# Patient Record
Sex: Male | Born: 1940 | Race: White | Hispanic: No | Marital: Married | Smoking: Former smoker
Health system: Southern US, Community
[De-identification: ages and names within clinical notes are randomized; demographics above are authoritative.]

## PROBLEM LIST (undated history)

## (undated) DIAGNOSIS — M109 Gout, unspecified: Secondary | ICD-10-CM

## (undated) DIAGNOSIS — M1990 Unspecified osteoarthritis, unspecified site: Secondary | ICD-10-CM

## (undated) DIAGNOSIS — F32A Depression, unspecified: Secondary | ICD-10-CM

## (undated) DIAGNOSIS — I10 Essential (primary) hypertension: Secondary | ICD-10-CM

## (undated) DIAGNOSIS — G4730 Sleep apnea, unspecified: Secondary | ICD-10-CM

## (undated) DIAGNOSIS — E119 Type 2 diabetes mellitus without complications: Secondary | ICD-10-CM

## (undated) DIAGNOSIS — I499 Cardiac arrhythmia, unspecified: Secondary | ICD-10-CM

## (undated) DIAGNOSIS — F329 Major depressive disorder, single episode, unspecified: Secondary | ICD-10-CM

## (undated) HISTORY — PX: FINGER ARTHROSCOPY: SHX5001

---

## 1999-05-13 ENCOUNTER — Ambulatory Visit: Admission: RE | Admit: 1999-05-13 | Discharge: 1999-05-13 | Payer: Self-pay | Admitting: Internal Medicine

## 2005-09-30 ENCOUNTER — Ambulatory Visit: Payer: Self-pay | Admitting: Internal Medicine

## 2005-11-03 ENCOUNTER — Ambulatory Visit: Payer: Self-pay | Admitting: Internal Medicine

## 2009-01-11 ENCOUNTER — Ambulatory Visit: Payer: Self-pay | Admitting: Internal Medicine

## 2010-02-22 ENCOUNTER — Inpatient Hospital Stay: Payer: Self-pay | Admitting: Internal Medicine

## 2010-05-08 ENCOUNTER — Emergency Department: Payer: Self-pay | Admitting: Emergency Medicine

## 2011-04-24 ENCOUNTER — Ambulatory Visit: Payer: Self-pay | Admitting: Internal Medicine

## 2011-08-26 ENCOUNTER — Ambulatory Visit: Payer: Self-pay | Admitting: Internal Medicine

## 2011-09-12 ENCOUNTER — Ambulatory Visit: Payer: Self-pay | Admitting: Unknown Physician Specialty

## 2012-08-27 ENCOUNTER — Other Ambulatory Visit: Payer: Self-pay | Admitting: Orthopedic Surgery

## 2012-09-02 ENCOUNTER — Encounter (HOSPITAL_COMMUNITY)
Admission: RE | Admit: 2012-09-02 | Discharge: 2012-09-02 | Disposition: A | Discharge status: Home / Self Care | Payer: Medicare Other | Source: Ambulatory Visit | Attending: Orthopedic Surgery | Admitting: Orthopedic Surgery

## 2012-09-02 ENCOUNTER — Encounter (HOSPITAL_COMMUNITY): Payer: Self-pay

## 2012-09-02 ENCOUNTER — Encounter (HOSPITAL_COMMUNITY): Payer: Self-pay | Admitting: Respiratory Therapy

## 2012-09-02 HISTORY — DX: Essential (primary) hypertension: I10

## 2012-09-02 HISTORY — DX: Unspecified osteoarthritis, unspecified site: M19.90

## 2012-09-02 HISTORY — DX: Type 2 diabetes mellitus without complications: E11.9

## 2012-09-02 HISTORY — DX: Depression, unspecified: F32.A

## 2012-09-02 HISTORY — DX: Major depressive disorder, single episode, unspecified: F32.9

## 2012-09-02 HISTORY — DX: Cardiac arrhythmia, unspecified: I49.9

## 2012-09-02 HISTORY — DX: Sleep apnea, unspecified: G47.30

## 2012-09-02 HISTORY — DX: Gout, unspecified: M10.9

## 2012-09-02 LAB — CBC
HCT: 42.8 % (ref 39.0–52.0)
Hemoglobin: 14.6 g/dL (ref 13.0–17.0)
MCH: 29.3 pg (ref 26.0–34.0)
MCHC: 34.1 g/dL (ref 30.0–36.0)
RDW: 13.2 % (ref 11.5–15.5)

## 2012-09-02 LAB — SURGICAL PCR SCREEN: Staphylococcus aureus: NEGATIVE

## 2012-09-02 LAB — PROTIME-INR: Prothrombin Time: 15.4 seconds — ABNORMAL HIGH (ref 11.6–15.2)

## 2012-09-02 LAB — BASIC METABOLIC PANEL
BUN: 19 mg/dL (ref 6–23)
Creatinine, Ser: 1.24 mg/dL (ref 0.50–1.35)
GFR calc non Af Amer: 56 mL/min — ABNORMAL LOW (ref >90)
Glucose, Bld: 153 mg/dL — ABNORMAL HIGH (ref 70–99)
Potassium: 4.2 mEq/L (ref 3.5–5.1)

## 2012-09-02 NOTE — Pre-Procedure Instructions (Signed)
20 Kacy Conely  09/02/2012   Your procedure is scheduled on:  09/10/12  Report to [facility] at 10 AM.  Call this number if you have problems the morning of surgery: [phone]   Remember:   Do not eat food:After Midnight.  May h Take these medicines the morning of surgery with A SIP OF WATER: carvedilol,allopurinol, amlodipine,lexapro   Do not wear jewelry, make-up or nail polish.  Do not wear lotions, powders, or perfumes. You may wear deodorant.  Do not shave 48 hours prior to surgery. Men may shave face and neck.  Do not bring valuables to the hospital.  Contacts, dentures or bridgework may not be worn into surgery.  Leave suitcase in the car. After surgery it may be brought to your room.  For patients admitted to the hospital, checkout time is 11:00 AM the day of discharge.   Patients discharged the day of surgery will not be allowed to drive home.  Name and phone number of your driver: family  Special Instructions: Shower using CHG 2 nights before surgery and the night before surgery.  If you shower the day of surgery use CHG.  Use special wash - you have one bottle of CHG for all showers.  You should use approximately 1/3 of the bottle for each shower.   Please read over the following fact sheets that you were given: Pain Booklet, Coughing and Deep Breathing, MRSA Information and Surgical Site Infection Prevention

## 2012-09-02 NOTE — Progress Notes (Signed)
Sleep study also requested from dr Lavera Guise

## 2012-09-02 NOTE — Consult Note (Addendum)
Anesthesia Chart Review:  Patient is a 71 year old male scheduled for bilateral knee arthroscopy for medial meniscal tear on 09/10/12 by Terrence Hill.  History includes morbid obesity, former smoker, HTN, OSA, DM2, paroxysmal atrial fibrillation (last episode May 2011), GI bleed from supra-therapeutic INR while on Coumadin, gout, depression.  PCP is Terrence Hill [phone]).  Cardiologist is Terrence Hill, last visit was on 08/05/11. By notes, it appears he was discharged back into the care of Terrence Hill unless additional input was needed for HTN or recurrent atrial arrhythmias or new cardiac issues.   His last EKG at [facility] was > 1 year ago--on 08/05/11 and showed bradycardia at 52 bpm, poor R wave progression, nonspecific T wave changes.  I called to request an EKG from Terrence Hill office, but they are closed until Monday.  If he has not had an EKG within the past year then he will need one on the day of surgery.  Nuclear stress test [facility]) on 06/07/10 showed a large defect in the inferior and inferoapical regions noted both at rest and stress images consistent with soft tissue attenuation. Cannot completely exclude prior old myocardial infarction. There is no superimposed ischemia. The left ventricular ejection fraction was calculated at 45% although visually the EF appears to be normal. This was considered a low risk study (read by Terrence Hill).  Echo on 02/26/10 showed normal LV systolic function, EF 55%. Moderate left atrial enlargement. Moderate LVH. Mild mitral and tricuspid insufficiency. (Copy from [place] [facility]; done at [facility]  CXR on 09/02/12 showed no acute intrathoracic abnormality.  Pre-operative labs noted.  He has been on Pradaxa, which can also prolong PTT.  Will plan to repeat PT/PTT on arrival.  I have notified Terrence Hill at [facility] office of patient's elevated PTT and to please instruct Terrence Hill on pre-operative Pradaxa instructions if not done so already.  I'll  review his EKG if one is received prior to his day of surgery, otherwise it will be evaluated by his Anesthesiologist.  Terrence Chock, PA-C 09/03/12 1610  Addendum: 09/06/12 1443 Terrence Hill from [facility]rence Hill office called.  He plans for patient to remain on Pradaxa for this procedure.  Addendum: 09/10/12 1045 Repeat EKG today showed afib @ 60 bpm, LAD, septal infarct (age undetermined).  He is asymptomatic.  He is unsure if he has developed any recurrent afib since May 2011.  He did report seeing Terrence Hill within the past three months.  I called Terrence Hill office who confirmed patient was seen there with an EKG in October 2013--despite there last office note originally faxed was dated from November 2012.  Patient's last dose of Pradaxa was on 09/09/12.  He did take his Coreg this morning.  Heart sounds are very distant, rhythm is irregular.  Lungs clear anteriorly.  Abdomen is obese.  No significant LE edema.  I requested [facility] to sent patient's most recent office notes.  Terrence Hill is in his office today if the anesthesiologist would like to speak with him further.  Terrence Hill updated.  (Update: Terrence Hill note from 07/05/12 does indicate that patient was back in afib.  His Coreg and Norvasc were both increased at that appointment to improve HTN control  BP recheck today was 147/96.)

## 2012-09-02 NOTE — Progress Notes (Signed)
Pt waiting to hear about stoping the pradaxa.Also called dr Jacinto Halim for ekg,stress test.

## 2012-09-08 NOTE — Progress Notes (Signed)
No EKG available from Dr. Milta Deiters office,will need to do DOS.

## 2012-09-09 MED ORDER — DEXTROSE 5 % IV SOLN
3.0000 g | INTRAVENOUS | Status: DC
  Filled 2012-09-09: qty 3000

## 2012-09-10 ENCOUNTER — Encounter (HOSPITAL_COMMUNITY): Payer: Self-pay | Admitting: *Deleted

## 2012-09-10 ENCOUNTER — Encounter (HOSPITAL_COMMUNITY)
Admission: RE | Disposition: A | Discharge status: Home / Self Care | Payer: Self-pay | Source: Ambulatory Visit | Attending: Orthopedic Surgery

## 2012-09-10 ENCOUNTER — Ambulatory Visit (HOSPITAL_COMMUNITY)
Admission: RE | Admit: 2012-09-10 | Discharge: 2012-09-10 | Disposition: A | Discharge status: Home / Self Care | Payer: Medicare Other | Source: Ambulatory Visit | Attending: Orthopedic Surgery | Admitting: Orthopedic Surgery

## 2012-09-10 ENCOUNTER — Encounter (HOSPITAL_COMMUNITY): Payer: Self-pay | Admitting: Vascular Surgery

## 2012-09-10 ENCOUNTER — Other Ambulatory Visit: Payer: Self-pay

## 2012-09-10 ENCOUNTER — Ambulatory Visit (HOSPITAL_COMMUNITY): Payer: Medicare Other | Admitting: Vascular Surgery

## 2012-09-10 DIAGNOSIS — M9420 Chondromalacia, unspecified site: Secondary | ICD-10-CM | POA: Insufficient documentation

## 2012-09-10 DIAGNOSIS — M23359 Other meniscus derangements, posterior horn of lateral meniscus, unspecified knee: Secondary | ICD-10-CM | POA: Insufficient documentation

## 2012-09-10 DIAGNOSIS — M23329 Other meniscus derangements, posterior horn of medial meniscus, unspecified knee: Secondary | ICD-10-CM | POA: Insufficient documentation

## 2012-09-10 DIAGNOSIS — S83209A Unspecified tear of unspecified meniscus, current injury, unspecified knee, initial encounter: Secondary | ICD-10-CM

## 2012-09-10 HISTORY — PX: KNEE ARTHROSCOPY: SHX127

## 2012-09-10 LAB — GLUCOSE, CAPILLARY: Glucose-Capillary: 136 mg/dL — ABNORMAL HIGH (ref 70–99)

## 2012-09-10 LAB — PROTIME-INR: INR: 1.16 (ref 0.00–1.49)

## 2012-09-10 LAB — APTT: aPTT: 51 seconds — ABNORMAL HIGH (ref 24–37)

## 2012-09-10 SURGERY — ARTHROSCOPY, KNEE, BILATERAL
Anesthesia: General | Site: Knee | Laterality: Bilateral | Wound class: Clean

## 2012-09-10 MED ORDER — BUPIVACAINE-EPINEPHRINE (PF) 0.5% -1:200000 IJ SOLN
INTRAMUSCULAR | Status: AC
  Filled 2012-09-10: qty 10

## 2012-09-10 MED ORDER — LIDOCAINE HCL (CARDIAC) 20 MG/ML IV SOLN
INTRAVENOUS | Status: DC | PRN
  Administered 2012-09-10: 100 mg via INTRAVENOUS

## 2012-09-10 MED ORDER — ONDANSETRON HCL 4 MG/2ML IJ SOLN: 4.0000 mg | Freq: Once | INTRAMUSCULAR | Status: DC | PRN

## 2012-09-10 MED ORDER — LACTATED RINGERS IV SOLN
INTRAVENOUS | Status: DC | PRN
  Administered 2012-09-10: 12:00:00 via INTRAVENOUS

## 2012-09-10 MED ORDER — HYDROMORPHONE HCL PF 1 MG/ML IJ SOLN: 0.2500 mg | INTRAMUSCULAR | Status: DC | PRN

## 2012-09-10 MED ORDER — LACTATED RINGERS IV SOLN
INTRAVENOUS | Status: DC
  Administered 2012-09-10: 12:00:00 via INTRAVENOUS

## 2012-09-10 MED ORDER — HYDROCODONE-ACETAMINOPHEN 7.5-325 MG PO TABS
1.0000 | ORAL_TABLET | ORAL | Status: AC | PRN
Start: 2012-09-10 — End: ?

## 2012-09-10 MED ORDER — KETOROLAC TROMETHAMINE 30 MG/ML IJ SOLN
INTRAMUSCULAR | Status: AC
  Filled 2012-09-10: qty 1

## 2012-09-10 MED ORDER — HYDROMORPHONE HCL PF 1 MG/ML IJ SOLN
INTRAMUSCULAR | Status: AC
  Filled 2012-09-10: qty 1

## 2012-09-10 MED ORDER — CHLORHEXIDINE GLUCONATE 4 % EX LIQD: 60.0000 mL | Freq: Once | CUTANEOUS | Status: DC

## 2012-09-10 MED ORDER — ACETAMINOPHEN 10 MG/ML IV SOLN: 1000.0000 mg | Freq: Once | INTRAVENOUS | Status: DC | PRN

## 2012-09-10 MED ORDER — PROPOFOL 10 MG/ML IV BOLUS
INTRAVENOUS | Status: DC | PRN
  Administered 2012-09-10: 300 mg via INTRAVENOUS

## 2012-09-10 MED ORDER — BUPIVACAINE-EPINEPHRINE 0.5% -1:200000 IJ SOLN
INTRAMUSCULAR | Status: DC | PRN
  Administered 2012-09-10: 30 mL

## 2012-09-10 MED ORDER — DEXTROSE-NACL 5-0.45 % IV SOLN: INTRAVENOUS | Status: DC

## 2012-09-10 MED ORDER — ONDANSETRON HCL 4 MG/2ML IJ SOLN
INTRAMUSCULAR | Status: DC | PRN
  Administered 2012-09-10: 4 mg via INTRAVENOUS

## 2012-09-10 MED ORDER — SODIUM CHLORIDE 0.9 % IR SOLN
Status: DC | PRN
  Administered 2012-09-10: 6000 mL

## 2012-09-10 MED ORDER — FENTANYL CITRATE 0.05 MG/ML IJ SOLN
INTRAMUSCULAR | Status: DC | PRN
  Administered 2012-09-10: 25 ug via INTRAVENOUS
  Administered 2012-09-10: 100 ug via INTRAVENOUS
  Administered 2012-09-10: 25 ug via INTRAVENOUS

## 2012-09-10 SURGICAL SUPPLY — 44 items
BANDAGE ELASTIC 6 VELCRO ST LF (GAUZE/BANDAGES/DRESSINGS) ×2 IMPLANT
BANDAGE GAUZE ELAST BULKY 4 IN (GAUZE/BANDAGES/DRESSINGS) ×2 IMPLANT
BLADE GREAT WHITE 4.2 (BLADE) ×2 IMPLANT
BLADE SURG 11 STRL SS (BLADE) IMPLANT
BNDG CMPR MED 10X6 ELC LF (GAUZE/BANDAGES/DRESSINGS) ×2
BNDG COHESIVE 6X5 TAN STRL LF (GAUZE/BANDAGES/DRESSINGS) ×2 IMPLANT
BNDG ELASTIC 6X10 VLCR STRL LF (GAUZE/BANDAGES/DRESSINGS) ×2 IMPLANT
CLOTH BEACON ORANGE TIMEOUT ST (SAFETY) ×2 IMPLANT
COVER SURGICAL LIGHT HANDLE (MISCELLANEOUS) ×2 IMPLANT
CUFF TOURNIQUET SINGLE 34IN LL (TOURNIQUET CUFF) IMPLANT
CUFF TOURNIQUET SINGLE 44IN (TOURNIQUET CUFF) IMPLANT
DRAPE EXTREMITY BILATERAL (DRAPE) ×2 IMPLANT
DRAPE STERI 35X30 U-POUCH (DRAPES) ×4 IMPLANT
DRAPE U-SHAPE 47X51 STRL (DRAPES) ×2 IMPLANT
DRSG EMULSION OIL 3X3 NADH (GAUZE/BANDAGES/DRESSINGS) ×2 IMPLANT
DRSG PAD ABDOMINAL 8X10 ST (GAUZE/BANDAGES/DRESSINGS) ×3 IMPLANT
DURAPREP 26ML APPLICATOR (WOUND CARE) ×2 IMPLANT
GAUZE XEROFORM 1X8 LF (GAUZE/BANDAGES/DRESSINGS) ×1 IMPLANT
GLOVE BIO SURGEON STRL SZ8.5 (GLOVE) ×2 IMPLANT
GLOVE BIOGEL PI IND STRL 8 (GLOVE) ×1 IMPLANT
GLOVE BIOGEL PI INDICATOR 8 (GLOVE) ×1
GLOVE SS BIOGEL STRL SZ 8 (GLOVE) ×1 IMPLANT
GLOVE SUPERSENSE BIOGEL SZ 8 (GLOVE) ×1
GOWN PREVENTION PLUS XLARGE (GOWN DISPOSABLE) ×6 IMPLANT
GOWN PREVENTION PLUS XXLARGE (GOWN DISPOSABLE) ×2 IMPLANT
GOWN STRL NON-REIN LRG LVL3 (GOWN DISPOSABLE) ×6 IMPLANT
KIT ROOM TURNOVER OR (KITS) ×2 IMPLANT
MANIFOLD NEPTUNE II (INSTRUMENTS) ×2 IMPLANT
NDL 18GX1X1/2 (RX/OR ONLY) (NEEDLE) ×1 IMPLANT
NDL SPNL 18GX3.5 QUINCKE PK (NEEDLE) IMPLANT
NEEDLE 18GX1X1/2 (RX/OR ONLY) (NEEDLE) ×2 IMPLANT
NEEDLE 22X1 1/2 (OR ONLY) (NEEDLE) IMPLANT
NEEDLE SPNL 18GX3.5 QUINCKE PK (NEEDLE) IMPLANT
PACK ARTHROSCOPY DSU (CUSTOM PROCEDURE TRAY) ×2 IMPLANT
PAD ARMBOARD 7.5X6 YLW CONV (MISCELLANEOUS) ×4 IMPLANT
PAD CAST 4YDX4 CTTN HI CHSV (CAST SUPPLIES) IMPLANT
PADDING CAST COTTON 4X4 STRL (CAST SUPPLIES) ×4
SET ARTHROSCOPY TUBING (MISCELLANEOUS) ×2
SET ARTHROSCOPY TUBING LN (MISCELLANEOUS) ×1 IMPLANT
SPONGE GAUZE 4X4 12PLY (GAUZE/BANDAGES/DRESSINGS) ×2 IMPLANT
STOCKINETTE IMPERVIOUS LG (DRAPES) ×2 IMPLANT
SYR CONTROL 10ML LL (SYRINGE) ×2 IMPLANT
TOWEL OR 17X24 6PK STRL BLUE (TOWEL DISPOSABLE) ×4 IMPLANT
WATER STERILE IRR 1000ML POUR (IV SOLUTION) ×2 IMPLANT

## 2012-09-10 NOTE — Anesthesia Postprocedure Evaluation (Signed)
  Anesthesia Post-op Note  Patient: Terrence Hill  Procedure(s) Performed: Procedure(s) (LRB) with comments: ARTHROSCOPY KNEE BILATERAL (Bilateral) - bilateral knee arthroscopy  Patient Location: PACU  Anesthesia Type:General  Level of Consciousness: awake, alert  and oriented  Airway and Oxygen Therapy: Patient Spontanous Breathing and Patient connected to nasal cannula oxygen  Post-op Pain: mild  Post-op Assessment: Post-op Vital signs reviewed and Patient's Cardiovascular Status Stable  Post-op Vital Signs: stable  Complications: No apparent anesthesia complications

## 2012-09-10 NOTE — Progress Notes (Signed)
Dr Noreene Larsson called for sign out.

## 2012-09-10 NOTE — Interval H&P Note (Signed)
History and Physical Interval Note:  09/10/2012 12:13 PM  Terrence Hill  has presented today for surgery, with the diagnosis of Bilateral Medial Mensical Tear  The various methods of treatment have been discussed with the patient and family. After consideration of risks, benefits and other options for treatment, the patient has consented to  Procedure(s) (LRB) with comments: ARTHROSCOPY KNEE BILATERAL (Bilateral) as a surgical intervention .  The patient's history has been reviewed, patient examined, no change in status, stable for surgery.  I have reviewed the patient's chart and labs.  Questions were answered to the patient's satisfaction.     Nestor Lewandowsky

## 2012-09-10 NOTE — H&P (View-Only) (Signed)
No EKG available from Dr. Khan's office,will need to do DOS. 

## 2012-09-10 NOTE — Anesthesia Preprocedure Evaluation (Signed)
Anesthesia Evaluation  Patient identified by MRN, date of birth, ID band Patient awake    Reviewed: Allergy & Precautions, H&P , NPO status , Patient's Chart, lab work & pertinent test results, reviewed documented beta blocker date and time   Airway Mallampati: II      Dental  (+) Teeth Intact and Dental Advisory Given   Pulmonary  breath sounds clear to auscultation        Cardiovascular Rhythm:Irregular Rate:Normal     Neuro/Psych    GI/Hepatic   Endo/Other    Renal/GU      Musculoskeletal   Abdominal (+) + obese,   Peds  Hematology   Anesthesia Other Findings   Reproductive/Obstetrics                           Anesthesia Physical Anesthesia Plan  ASA: III  Anesthesia Plan: General   Post-op Pain Management:    Induction: Intravenous  Airway Management Planned: LMA  Additional Equipment:   Intra-op Plan:   Post-operative Plan:   Informed Consent: I have reviewed the patients History and Physical, chart, labs and discussed the procedure including the risks, benefits and alternatives for the proposed anesthesia with the patient or authorized representative who has indicated his/her understanding and acceptance.   Dental advisory given  Plan Discussed with: CRNA and Surgeon  Anesthesia Plan Comments: (Bilat DJD knees  Type 2 DM glucose 153 Htn Morbid obesity Gout htn  Plan GA with oral ETT  Kipp Brood, MD  Chronic Afib rate controlled EF 55% by ECHO 03/04/10. (-) Nuclear Stress test 06/07/10, last took pradaxa yesterday )        Anesthesia Quick Evaluation

## 2012-09-10 NOTE — Transfer of Care (Signed)
Immediate Anesthesia Transfer of Care Note  Patient: Terrence Hill  Procedure(s) Performed: Procedure(s) (LRB) with comments: ARTHROSCOPY KNEE BILATERAL (Bilateral) - bilateral knee arthroscopy  Patient Location: PACU  Anesthesia Type:General  Level of Consciousness: awake, alert , oriented and patient cooperative  Airway & Oxygen Therapy: Patient Spontanous Breathing and Patient connected to face mask oxygen  Post-op Assessment: Report given to PACU RN, Post -op Vital signs reviewed and stable and Patient moving all extremities  Post vital signs: Reviewed and stable  Complications: No apparent anesthesia complications

## 2012-09-10 NOTE — H&P (Signed)
  Subjective: Patient seen by Dr. Thurston Hole back in April 2013, with moderate degenerative arthritis, left greater than right knee.  Elevate and treated by his primary care physician with anti-inflammatory medicines and had a cortisone injection a years ago that helped him for almost 3-4 years.  After obtaining x-rays Dr. Thurston Hole injected both knees with cortisone, and forcefully on the left.  The shot only lasted a few hours and on the right only a few weeks.  Since then he has taken over-the-counter anti-inflammatory medicines, but continues to have intermittent pain, catching and grinding, left greater than right knee.  A week ago.  The right knee actually hurt so bad he had trouble even walking and then after a day or so.  It got better.  Both knees ache, mainly on the inside.    His past medical history is significant for hypertension, a history of gout.  No known drug allergies.  Family Hx: DM, HTN  Social Hx: Denies tobacco or alcohol.  Retired.    ROS: Patient denies dizziness, nausea, fever, chills, vomiting, shortness of breath, chest pain, loss of appetite, or rash.    PHYSICAL EXAM: Well-developed, well-nourished.  Awake, alert, and oriented x3.  Extraocular motion is intact.  No use of accessory respiratory muscles for breathing.   Cardiovascular exam reveals a regular rhythm.  Skin is intact without cuts, scrapes, or abrasions.  6'6" tall, 400 lbs. Both knees have 5 flexion contractures and flex to about 120.  There is subpatellar crepitus, left greater than right.  He is tender along the medial joint line, left greater than right knee and McMurray's test reproduces pain, left greater than right.  X-rays ordered and interpreted by me including standing AP, Rosenberg, lateral and sunrise show loss of one to 2 mm of cartilage medially right greater than left, small peripheral osteophytes and irregularity of the distal femoral condyles, left greater than right, as well as.  Normal pulses to  the foot.  He is neurovascularly intact.  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  Plan: I offered him repeat cortisone injections today, but based on the poor response to the last injections he would like to proceed arthroscopic decompression of both knees.  The risks and benefits of surgery discussed at length, I will see him back at the time of surgical intervention

## 2012-09-10 NOTE — Progress Notes (Signed)
Call to dr. Turner Daniels regarding tordal order. He stated he did not ordered it and does not wish it to be given.  Patient credited for tordal.

## 2012-09-10 NOTE — Anesthesia Procedure Notes (Signed)
Procedure Name: LMA Insertion Date/Time: 09/10/2012 1:01 PM Performed by: Jerilee Hoh Pre-anesthesia Checklist: Patient identified, Emergency Drugs available, Suction available and Patient being monitored Patient Re-evaluated:Patient Re-evaluated prior to inductionOxygen Delivery Method: Circle system utilized Preoxygenation: Pre-oxygenation with 100% oxygen Intubation Type: IV induction LMA: LMA with gastric port inserted LMA Size: 5.0 Tube type: Oral Number of attempts: 1 Placement Confirmation: positive ETCO2 and breath sounds checked- equal and bilateral Tube secured with: Tape Dental Injury: Teeth and Oropharynx as per pre-operative assessment

## 2012-09-10 NOTE — Op Note (Signed)
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 Turner Daniels M.D.  Assist: Shirl Harris PA-C  Anes: General LMA  EBL: Minimal  Fluids: 800 cc   Indications: Bilateral medial joint line pain of the knees for over a year having failed conservative treatment with anti-inflammatory medicines physical therapy good temporarily to cortisone injection. X-rays are consistent with early calcium pyrophosphate disease and some loss of joint space medially consistent with medial meniscal tears. Patient has catching popping and pain right knee red left knee and the pain wakes him at night. Pain has recurred and patient desires elective arthroscopic evaluation and treatment of both knee. Risks and benefits of surgery have been discussed and questions answered.  Procedure: Patient identified by arm band and taken to the operating room at the day surgery Center. The appropriate anesthetic monitors were attached, and General LMA anesthesia was induced without difficulty. Lateral posts were applied to the table and the lower extremities were prepped and draped in usual sterile fashion from the ankle to the midthigh. Time out procedure was performed. We began the operation by making standard inferior lateral and inferior medial peripatellar portals with a #11 blade, on the right knee, allowing introduction of the arthroscope through the inferior lateral portal and the out flow to the inferior medial portal. Pump pressure was set at 100 mmHg and diagnostic arthroscopy  revealed complex degenerative tearing of the posterior horn of the medial meniscus and the lateral meniscus that required debridement with a 35 gray-white sucker shaver  and straight biter. The patient also had grade 3 chondromalacia to the medial femoral condyle this is debrider back to a stable margin as well as to a lesser extent the trochlea. The anterior cruciate ligament and PCL are intact. Small bits of articular cartilage were taken through the outflow, the donor site for this is probably the areas of chondromalacia appear. The knee was irrigated out normal saline solution. A dressing of xerofoam 4 x 4 dressing sponges, web roll and an Ace wrap was applied.   We began the operation on the left knee by making standard inferior lateral and inferior medial peripatellar portals with a #11 blade, allowing introduction of the arthroscope through the inferior lateral portal and the out flow to the inferior medial portal. Pump pressure was set at 100 mmHg and diagnostic arthroscopy  revealed complex degenerative tearing of the posterior horn of the medial meniscus and the lateral meniscus that required debridement with a 35 gray-white sucker shaver and straight biter. The patient also had grade 3 chondromalacia to the medial femoral condyle this wass debrided back to a stable margin as well as to a lesser extent the trochlea. The anterior cruciate ligament and PCL are intact. Small bits of articular cartilage were taken through the outflow from the areas of chondromalacia. The knee was irrigated out normal saline solution. A dressing of xerofoam 4 x 4 dressing sponges, web roll and an Ace wrap was applied.  The patient was awakened extubated and taken to the recovery without difficulty.    Signed: Nestor Lewandowsky, MD

## 2012-09-10 NOTE — Progress Notes (Signed)
EKG this AM shows A-FIB.Revonda Standard notified will come see pt.

## 2012-09-10 NOTE — Preoperative (Signed)
Beta Blockers   Reason not to administer Beta Blockers:Not Applicable 

## 2012-09-13 ENCOUNTER — Encounter (HOSPITAL_COMMUNITY): Payer: Self-pay | Admitting: Orthopedic Surgery

## 2012-09-13 LAB — GLUCOSE, CAPILLARY: Glucose-Capillary: 128 mg/dL — ABNORMAL HIGH (ref 70–99)

## 2012-10-07 ENCOUNTER — Ambulatory Visit: Payer: Self-pay | Admitting: Internal Medicine

## 2014-03-15 ENCOUNTER — Ambulatory Visit: Payer: Self-pay | Admitting: Ophthalmology

## 2014-03-15 DIAGNOSIS — I10 Essential (primary) hypertension: Secondary | ICD-10-CM

## 2014-03-15 DIAGNOSIS — Z01810 Encounter for preprocedural cardiovascular examination: Secondary | ICD-10-CM

## 2014-03-15 LAB — POTASSIUM: Potassium: 4 mmol/L (ref 3.5–5.1)

## 2014-03-28 ENCOUNTER — Ambulatory Visit: Payer: Self-pay | Admitting: Ophthalmology

## 2014-04-13 ENCOUNTER — Ambulatory Visit: Payer: Self-pay | Admitting: Ophthalmology

## 2014-04-13 LAB — POTASSIUM: Potassium: 4.1 mmol/L (ref 3.5–5.1)

## 2014-04-25 ENCOUNTER — Ambulatory Visit: Payer: Self-pay | Admitting: Ophthalmology

## 2014-06-27 ENCOUNTER — Inpatient Hospital Stay: Payer: Self-pay | Admitting: Internal Medicine

## 2014-06-27 ENCOUNTER — Emergency Department: Payer: Self-pay | Admitting: Emergency Medicine

## 2014-06-27 LAB — COMPREHENSIVE METABOLIC PANEL
ALBUMIN: 3.8 g/dL (ref 3.4–5.0)
ALK PHOS: 109 U/L
ALT: 23 U/L
ALT: 23 U/L
ANION GAP: 8 (ref 7–16)
ANION GAP: 7 (ref 7–16)
Albumin: 3.3 g/dL — ABNORMAL LOW (ref 3.4–5.0)
Alkaline Phosphatase: 93 U/L
BILIRUBIN TOTAL: 1.7 mg/dL — AB (ref 0.2–1.0)
BILIRUBIN TOTAL: 1.7 mg/dL — AB (ref 0.2–1.0)
BUN: 20 mg/dL — ABNORMAL HIGH (ref 7–18)
BUN: 21 mg/dL — ABNORMAL HIGH (ref 7–18)
CALCIUM: 8.6 mg/dL (ref 8.5–10.1)
CALCIUM: 8.2 mg/dL — AB (ref 8.5–10.1)
CO2: 23 mmol/L (ref 21–32)
CREATININE: 1.72 mg/dL — AB (ref 0.60–1.30)
CREATININE: 1.49 mg/dL — AB (ref 0.60–1.30)
Chloride: 102 mmol/L (ref 98–107)
Chloride: 103 mmol/L (ref 98–107)
Co2: 21 mmol/L (ref 21–32)
EGFR (African American): 50 — ABNORMAL LOW
EGFR (Non-African Amer.): 42 — ABNORMAL LOW
EGFR (Non-African Amer.): 49 — ABNORMAL LOW
GFR CALC AF AMER: 60 — AB
Glucose: 191 mg/dL — ABNORMAL HIGH (ref 65–99)
Glucose: 181 mg/dL — ABNORMAL HIGH (ref 65–99)
Osmolality: 274 (ref 275–301)
Osmolality: 270 (ref 275–301)
POTASSIUM: 4.1 mmol/L (ref 3.5–5.1)
Potassium: 3.8 mmol/L (ref 3.5–5.1)
SGOT(AST): 24 U/L (ref 15–37)
SGOT(AST): 36 U/L (ref 15–37)
SODIUM: 131 mmol/L — AB (ref 136–145)
Sodium: 133 mmol/L — ABNORMAL LOW (ref 136–145)
TOTAL PROTEIN: 8.4 g/dL — AB (ref 6.4–8.2)
TOTAL PROTEIN: 7.6 g/dL (ref 6.4–8.2)

## 2014-06-27 LAB — CBC
HCT: 43.9 % (ref 40.0–52.0)
HGB: 14 g/dL (ref 13.0–18.0)
MCH: 28 pg (ref 26.0–34.0)
MCHC: 31.9 g/dL — ABNORMAL LOW (ref 32.0–36.0)
MCV: 88 fL (ref 80–100)
PLATELETS: 113 10*3/uL — AB (ref 150–440)
RBC: 5.02 10*6/uL (ref 4.40–5.90)
RDW: 14.9 % — ABNORMAL HIGH (ref 11.5–14.5)
WBC: 10.4 10*3/uL (ref 3.8–10.6)

## 2014-06-27 LAB — DRUG SCREEN, URINE
Amphetamines, Ur Screen: NEGATIVE (ref ?–1000)
BARBITURATES, UR SCREEN: NEGATIVE (ref ?–200)
BENZODIAZEPINE, UR SCRN: NEGATIVE (ref ?–200)
CANNABINOID 50 NG, UR ~~LOC~~: NEGATIVE (ref ?–50)
Cocaine Metabolite,Ur ~~LOC~~: NEGATIVE (ref ?–300)
MDMA (ECSTASY) UR SCREEN: NEGATIVE (ref ?–500)
METHADONE, UR SCREEN: NEGATIVE (ref ?–300)
OPIATE, UR SCREEN: NEGATIVE (ref ?–300)
PHENCYCLIDINE (PCP) UR S: NEGATIVE (ref ?–25)
Tricyclic, Ur Screen: NEGATIVE (ref ?–1000)

## 2014-06-27 LAB — URINALYSIS, COMPLETE
BACTERIA: NONE SEEN
BACTERIA: NONE SEEN
BILIRUBIN, UR: NEGATIVE
Bilirubin,UR: NEGATIVE
Glucose,UR: 50 mg/dL (ref 0–75)
LEUKOCYTE ESTERASE: NEGATIVE
Leukocyte Esterase: NEGATIVE
NITRITE: NEGATIVE
Nitrite: NEGATIVE
PH: 5 (ref 4.5–8.0)
Ph: 6 (ref 4.5–8.0)
Protein: 100
RBC,UR: 193 /HPF (ref 0–5)
RBC,UR: 36 /HPF (ref 0–5)
SPECIFIC GRAVITY: 1.019 (ref 1.003–1.030)
Specific Gravity: 1.017 (ref 1.003–1.030)
WBC UR: 2 /HPF (ref 0–5)
WBC UR: 3 /HPF (ref 0–5)

## 2014-06-27 LAB — ETHANOL

## 2014-06-27 LAB — CBC WITH DIFFERENTIAL/PLATELET
BASOS ABS: 0.1 10*3/uL (ref 0.0–0.1)
BASOS PCT: 0.8 %
Eosinophil #: 0 10*3/uL (ref 0.0–0.7)
Eosinophil %: 0 %
HCT: 47.2 % (ref 40.0–52.0)
HGB: 15 g/dL (ref 13.0–18.0)
LYMPHS PCT: 6 %
Lymphocyte #: 0.6 10*3/uL — ABNORMAL LOW (ref 1.0–3.6)
MCH: 27.9 pg (ref 26.0–34.0)
MCHC: 31.8 g/dL — ABNORMAL LOW (ref 32.0–36.0)
MCV: 88 fL (ref 80–100)
MONO ABS: 0.6 x10 3/mm (ref 0.2–1.0)
Monocyte %: 6.1 %
NEUTROS ABS: 8.1 10*3/uL — AB (ref 1.4–6.5)
Neutrophil %: 87.1 %
PLATELETS: 123 10*3/uL — AB (ref 150–440)
RBC: 5.39 10*6/uL (ref 4.40–5.90)
RDW: 15.1 % — AB (ref 11.5–14.5)
WBC: 9.3 10*3/uL (ref 3.8–10.6)

## 2014-06-27 LAB — TROPONIN I
TROPONIN-I: 0.03 ng/mL
TROPONIN-I: 0.04 ng/mL
TROPONIN-I: 0.04 ng/mL

## 2014-06-27 LAB — CK TOTAL AND CKMB (NOT AT ARMC)
CK, TOTAL: 432 U/L — AB
CK, Total: 590 U/L — ABNORMAL HIGH
CK-MB: 1.6 ng/mL (ref 0.5–3.6)
CK-MB: 2.9 ng/mL (ref 0.5–3.6)

## 2014-06-27 LAB — TSH: Thyroid Stimulating Horm: 1.78 u[IU]/mL

## 2014-06-27 LAB — LIPASE, BLOOD: Lipase: 139 U/L (ref 73–393)

## 2014-06-27 LAB — PRO B NATRIURETIC PEPTIDE: B-TYPE NATIURETIC PEPTID: 11750 pg/mL — AB (ref 0–125)

## 2014-06-28 LAB — BASIC METABOLIC PANEL
Anion Gap: 8 (ref 7–16)
BUN: 23 mg/dL — ABNORMAL HIGH (ref 7–18)
CO2: 24 mmol/L (ref 21–32)
CREATININE: 1.77 mg/dL — AB (ref 0.60–1.30)
Calcium, Total: 8.4 mg/dL — ABNORMAL LOW (ref 8.5–10.1)
Chloride: 104 mmol/L (ref 98–107)
EGFR (African American): 49 — ABNORMAL LOW
EGFR (Non-African Amer.): 40 — ABNORMAL LOW
Glucose: 151 mg/dL — ABNORMAL HIGH (ref 65–99)
OSMOLALITY: 279 (ref 275–301)
Potassium: 3.6 mmol/L (ref 3.5–5.1)
SODIUM: 136 mmol/L (ref 136–145)

## 2014-06-28 LAB — HEPATIC FUNCTION PANEL A (ARMC)
ALK PHOS: 87 U/L
AST: 40 U/L — AB (ref 15–37)
Albumin: 3.2 g/dL — ABNORMAL LOW (ref 3.4–5.0)
Bilirubin, Direct: 0.3 mg/dL — ABNORMAL HIGH (ref 0.00–0.20)
Bilirubin,Total: 1.6 mg/dL — ABNORMAL HIGH (ref 0.2–1.0)
SGPT (ALT): 26 U/L
TOTAL PROTEIN: 7.4 g/dL (ref 6.4–8.2)

## 2014-06-28 LAB — CBC WITH DIFFERENTIAL/PLATELET
BASOS ABS: 0 10*3/uL (ref 0.0–0.1)
Bands: 6 %
Basophil %: 0.3 %
Eosinophil #: 0 10*3/uL (ref 0.0–0.7)
Eosinophil %: 0.1 %
HCT: 43.8 % (ref 40.0–52.0)
HGB: 14.3 g/dL (ref 13.0–18.0)
LYMPHS ABS: 0.8 10*3/uL — AB (ref 1.0–3.6)
LYMPHS PCT: 8.6 %
Lymphocytes: 7 %
MCH: 28.3 pg (ref 26.0–34.0)
MCHC: 32.7 g/dL (ref 32.0–36.0)
MCV: 87 fL (ref 80–100)
METAMYELOCYTE: 1 %
MONO ABS: 0.7 x10 3/mm (ref 0.2–1.0)
Monocyte %: 7.6 %
Monocytes: 9 %
Neutrophil #: 8.2 10*3/uL — ABNORMAL HIGH (ref 1.4–6.5)
Neutrophil %: 83.4 %
PLATELETS: 117 10*3/uL — AB (ref 150–440)
RBC: 5.05 10*6/uL (ref 4.40–5.90)
RDW: 14.8 % — ABNORMAL HIGH (ref 11.5–14.5)
Segmented Neutrophils: 77 %
WBC: 9.8 10*3/uL (ref 3.8–10.6)

## 2014-06-28 LAB — CK TOTAL AND CKMB (NOT AT ARMC)
CK, TOTAL: 595 U/L — AB
CK, TOTAL: 519 U/L — AB
CK-MB: 2.8 ng/mL (ref 0.5–3.6)
CK-MB: 2.7 ng/mL (ref 0.5–3.6)

## 2014-06-28 LAB — AMMONIA: Ammonia, Plasma: 27 mcmol/L (ref 11–32)

## 2014-06-28 LAB — TSH: Thyroid Stimulating Horm: 1.44 u[IU]/mL

## 2014-06-28 LAB — LACTATE DEHYDROGENASE: LDH: 311 U/L — AB (ref 85–241)

## 2014-06-28 LAB — TROPONIN I
TROPONIN-I: 0.06 ng/mL — AB
TROPONIN-I: 0.04 ng/mL

## 2014-06-28 LAB — D-DIMER(ARMC): D-Dimer: 1803 ng/ml

## 2014-06-28 LAB — FOLATE: Folic Acid: 9.1 ng/mL (ref 3.1–100.0)

## 2014-06-29 DIAGNOSIS — I517 Cardiomegaly: Secondary | ICD-10-CM

## 2014-06-29 LAB — BASIC METABOLIC PANEL
Anion Gap: 9 (ref 7–16)
BUN: 35 mg/dL — ABNORMAL HIGH (ref 7–18)
Calcium, Total: 8.6 mg/dL (ref 8.5–10.1)
Chloride: 100 mmol/L (ref 98–107)
Co2: 27 mmol/L (ref 21–32)
Creatinine: 2.01 mg/dL — ABNORMAL HIGH (ref 0.60–1.30)
EGFR (African American): 42 — ABNORMAL LOW
EGFR (Non-African Amer.): 35 — ABNORMAL LOW
GLUCOSE: 141 mg/dL — AB (ref 65–99)
Osmolality: 282 (ref 275–301)
POTASSIUM: 3.4 mmol/L — AB (ref 3.5–5.1)
Sodium: 136 mmol/L (ref 136–145)

## 2014-06-29 LAB — CBC WITH DIFFERENTIAL/PLATELET
Basophil #: 0.1 10*3/uL (ref 0.0–0.1)
Basophil %: 0.8 %
Eosinophil #: 0.1 10*3/uL (ref 0.0–0.7)
Eosinophil %: 1.1 %
HCT: 42.1 % (ref 40.0–52.0)
HGB: 13.2 g/dL (ref 13.0–18.0)
LYMPHS ABS: 1.1 10*3/uL (ref 1.0–3.6)
Lymphocyte %: 12.7 %
MCH: 27.5 pg (ref 26.0–34.0)
MCHC: 31.3 g/dL — ABNORMAL LOW (ref 32.0–36.0)
MCV: 88 fL (ref 80–100)
Monocyte #: 0.9 x10 3/mm (ref 0.2–1.0)
Monocyte %: 11 %
Neutrophil #: 6.2 10*3/uL (ref 1.4–6.5)
Neutrophil %: 74.4 %
Platelet: 122 10*3/uL — ABNORMAL LOW (ref 150–440)
RBC: 4.78 10*6/uL (ref 4.40–5.90)
RDW: 14.7 % — ABNORMAL HIGH (ref 11.5–14.5)
WBC: 8.3 10*3/uL (ref 3.8–10.6)

## 2014-06-29 LAB — PLATELET COUNT: Platelet: 117 10*3/uL — ABNORMAL LOW (ref 150–440)

## 2014-06-29 LAB — SEDIMENTATION RATE: ERYTHROCYTE SED RATE: 30 mm/h — AB (ref 0–20)

## 2014-06-29 LAB — HEMOGLOBIN: HGB: 13.3 g/dL (ref 13.0–18.0)

## 2014-06-29 LAB — CLOSTRIDIUM DIFFICILE(ARMC)

## 2014-06-29 LAB — OCCULT BLOOD X 1 CARD TO LAB, STOOL: Occult Blood, Feces: POSITIVE

## 2014-06-30 LAB — CBC WITH DIFFERENTIAL/PLATELET
BASOS ABS: 0 10*3/uL (ref 0.0–0.1)
Basophil %: 0.6 %
EOS PCT: 0.8 %
Eosinophil #: 0.1 10*3/uL (ref 0.0–0.7)
HCT: 39.9 % — AB (ref 40.0–52.0)
HGB: 12.7 g/dL — ABNORMAL LOW (ref 13.0–18.0)
LYMPHS PCT: 20.5 %
Lymphocyte #: 1.4 10*3/uL (ref 1.0–3.6)
MCH: 27.7 pg (ref 26.0–34.0)
MCHC: 31.7 g/dL — ABNORMAL LOW (ref 32.0–36.0)
MCV: 87 fL (ref 80–100)
MONOS PCT: 11 %
Monocyte #: 0.8 x10 3/mm (ref 0.2–1.0)
NEUTROS PCT: 67.1 %
Neutrophil #: 4.7 10*3/uL (ref 1.4–6.5)
PLATELETS: 114 10*3/uL — AB (ref 150–440)
RBC: 4.57 10*6/uL (ref 4.40–5.90)
RDW: 15 % — ABNORMAL HIGH (ref 11.5–14.5)
WBC: 7 10*3/uL (ref 3.8–10.6)

## 2014-06-30 LAB — COMPREHENSIVE METABOLIC PANEL
ALK PHOS: 95 U/L
ALT: 42 U/L
Albumin: 2.6 g/dL — ABNORMAL LOW (ref 3.4–5.0)
Anion Gap: 9 (ref 7–16)
BUN: 37 mg/dL — AB (ref 7–18)
Bilirubin,Total: 1.4 mg/dL — ABNORMAL HIGH (ref 0.2–1.0)
CALCIUM: 8 mg/dL — AB (ref 8.5–10.1)
CHLORIDE: 104 mmol/L (ref 98–107)
CO2: 26 mmol/L (ref 21–32)
Creatinine: 1.69 mg/dL — ABNORMAL HIGH (ref 0.60–1.30)
EGFR (African American): 51 — ABNORMAL LOW
GFR CALC NON AF AMER: 42 — AB
GLUCOSE: 134 mg/dL — AB (ref 65–99)
Osmolality: 288 (ref 275–301)
Potassium: 3.3 mmol/L — ABNORMAL LOW (ref 3.5–5.1)
SGOT(AST): 41 U/L — ABNORMAL HIGH (ref 15–37)
SODIUM: 139 mmol/L (ref 136–145)
TOTAL PROTEIN: 6.6 g/dL (ref 6.4–8.2)

## 2014-06-30 LAB — PROTEIN ELECTROPHORESIS(ARMC)

## 2014-06-30 LAB — PROTIME-INR
INR: 1.4
Prothrombin Time: 17.2 secs — ABNORMAL HIGH (ref 11.5–14.7)

## 2014-06-30 LAB — MAGNESIUM: MAGNESIUM: 2.1 mg/dL

## 2014-07-01 LAB — URINALYSIS, COMPLETE
BACTERIA: NONE SEEN
BILIRUBIN, UR: NEGATIVE
Glucose,UR: NEGATIVE mg/dL (ref 0–75)
KETONE: NEGATIVE
Leukocyte Esterase: NEGATIVE
Nitrite: NEGATIVE
Ph: 5 (ref 4.5–8.0)
Protein: NEGATIVE
Specific Gravity: 1.016 (ref 1.003–1.030)
Squamous Epithelial: <1
WBC UR: 5 /HPF (ref 0–5)

## 2014-07-01 LAB — BASIC METABOLIC PANEL
Anion Gap: 7 (ref 7–16)
BUN: 30 mg/dL — AB (ref 7–18)
CREATININE: 1.49 mg/dL — AB (ref 0.60–1.30)
Calcium, Total: 8.1 mg/dL — ABNORMAL LOW (ref 8.5–10.1)
Chloride: 106 mmol/L (ref 98–107)
Co2: 26 mmol/L (ref 21–32)
EGFR (African American): 60 — ABNORMAL LOW
EGFR (Non-African Amer.): 49 — ABNORMAL LOW
Glucose: 128 mg/dL — ABNORMAL HIGH (ref 65–99)
Osmolality: 285 (ref 275–301)
Potassium: 3.7 mmol/L (ref 3.5–5.1)
SODIUM: 139 mmol/L (ref 136–145)

## 2014-07-01 LAB — URINE CULTURE

## 2014-07-02 LAB — STOOL CULTURE

## 2014-07-02 LAB — CULTURE, BLOOD (SINGLE)

## 2014-07-04 LAB — UR PROT ELECTROPHORESIS, URINE RANDOM

## 2014-09-18 ENCOUNTER — Other Ambulatory Visit: Payer: Self-pay | Admitting: Orthopedic Surgery

## 2014-09-18 DIAGNOSIS — M542 Cervicalgia: Secondary | ICD-10-CM

## 2014-09-29 ENCOUNTER — Other Ambulatory Visit: Payer: Self-pay | Admitting: Orthopedic Surgery

## 2014-09-29 ENCOUNTER — Ambulatory Visit
Admission: RE | Admit: 2014-09-29 | Discharge: 2014-09-29 | Disposition: A | Discharge status: Home / Self Care | Payer: Medicare Other | Source: Ambulatory Visit | Attending: Orthopedic Surgery | Admitting: Orthopedic Surgery

## 2014-09-29 DIAGNOSIS — M542 Cervicalgia: Secondary | ICD-10-CM

## 2014-12-22 DEATH — deceased

## 2015-01-13 NOTE — Consult Note (Signed)
Pt seen and examined. Please see Wilhelmenia BlaseKaryn Earle's notes. Pt on CPAP. One episode of melena. Hgb remains stable. Pt on predexa. Nausea/vomiting and diarrhea has almost resolved. Pt had gastric ulcer in 2011 and gastritis in 2012. Also, c/o intermittent dysphagia. Started patient on protonix bid. Hopefully, GI symptoms will resolve over next few days. However, if melena persists with dropping hgb, then will schedule EGD with possible dilation. Pt will have to be off predexa for 2-3 days( esp with his renal insufficiency) before we can schedule EGD. Would prefer EGD be done as outpt. Will follow. thanks.  Electronic Signatures: Lutricia Feilh, Presten Joost (MD)  (Signed on 08-Oct-15 16:26)  Authored  Last Updated: 08-Oct-15 16:26 by Lutricia Feilh, Randie Tallarico (MD)

## 2015-01-13 NOTE — Op Note (Signed)
PATIENT NAME:  Terrence Hill, Terrence Hill MR#:  [id number] DATE OF BIRTH:  11-23-1940  DATE OF PROCEDURE:  04/25/2014  PREOPERATIVE DIAGNOSIS: Visually significant cataract of the left eye.   POSTOPERATIVE DIAGNOSIS: Visually significant cataract of the left eye.   OPERATIVE PROCEDURE: Cataract extraction by phacoemulsification with implant of intraocular lens to left eye.   SURGEON: Galen ManilaWilliam Timera Windt, MD.   ANESTHESIA:  1. Managed anesthesia care.  2. Topical tetracaine drops followed by 2% Xylocaine jelly applied in the preoperative holding area.   COMPLICATIONS: None.   TECHNIQUE:  Stop and chop.   DESCRIPTION OF PROCEDURE: The patient was examined and consented in the preoperative holding area where the aforementioned topical anesthesia was applied to the left eye and then brought back to the Operating Room where the left eye was prepped and draped in the usual sterile ophthalmic fashion and a lid speculum was placed. A paracentesis was created with the side port blade and the anterior chamber was filled with viscoelastic. A near clear corneal incision was performed with the steel keratome. A continuous curvilinear capsulorrhexis was performed with a cystotome followed by the capsulorrhexis forceps. Hydrodissection and hydrodelineation were carried out with BSS on a blunt cannula. The lens was removed in a stop and chop technique and the remaining cortical material was removed with the irrigation-aspiration handpiece. The capsular bag was inflated with viscoelastic and the Tecnis ZCB00 19.0-diopter lens, serial number [id number] was placed in the capsular bag without complication. The remaining viscoelastic was removed from the eye with the irrigation-aspiration handpiece. The wounds were hydrated. The anterior chamber was flushed with Miostat and the eye was inflated to physiologic pressure. 0.1 mL of cefuroxime concentration 10 mg/mL was placed in the anterior chamber. The wounds were found to be water  tight. The eye was dressed with Vigamox. The patient was given protective glasses to wear throughout the day and a shield with which to sleep tonight. The patient was also given drops with which to begin a drop regimen today and will follow-up with me in one day.    ____________________________ Jerilee FieldWilliam L. Jeniel Slauson, MD wlp:jh D: 04/25/2014 22:52:21 ET T: 04/26/2014 05:32:36 ET JOB#: [id number]  cc: Leia Coletti L. Keyna Blizard, MD, <Dictator> Jerilee FieldWILLIAM L Amin Fornwalt MD ELECTRONICALLY SIGNED 04/26/2014 16:34

## 2015-01-13 NOTE — Op Note (Signed)
PATIENT NAME:  Terrence Hill, Terrence Hill MR#:  [id number] DATE OF BIRTH:  10-22-1940  DATE OF PROCEDURE:  03/28/2014  PREOPERATIVE DIAGNOSIS: Visually significant cataract of the right eye.   POSTOPERATIVE DIAGNOSIS: Visually significant cataract of the right eye.   OPERATIVE PROCEDURE: Cataract extraction by phacoemulsification with implant of intraocular lens to right eye.   SURGEON: Galen ManilaWilliam Christen Wardrop, MD.   ANESTHESIA:  1. Managed anesthesia care.  2. Topical tetracaine drops followed by 2% Xylocaine jelly applied in the preoperative holding area.   COMPLICATIONS: None.   TECHNIQUE:  Stop and chop.  DESCRIPTION OF PROCEDURE: The patient was examined and consented in the preoperative holding area where the aforementioned topical anesthesia was applied to the right eye and then brought back to the Operating Room where the right eye was prepped and draped in the usual sterile ophthalmic fashion and a lid speculum was placed. A paracentesis was created with the side port blade and the anterior chamber was filled with viscoelastic. A near clear corneal incision was performed with the steel keratome. A continuous curvilinear capsulorrhexis was performed with a cystotome followed by the capsulorrhexis forceps. Hydrodissection and hydrodelineation were carried out with BSS on a blunt cannula. The lens was removed in a stop and chop technique and the remaining cortical material was removed with the irrigation-aspiration handpiece. The capsular bag was inflated with viscoelastic and the Alcon SN60WF Tecnis ZCB00 19.5-diopter lens, serial number [id number], was placed in the capsular bag without complication. The remaining viscoelastic was removed from the eye with the irrigation-aspiration handpiece. The wounds were hydrated. The anterior chamber was flushed with Miostat and the eye was inflated to physiologic pressure. 0.1 mL of cefuroxime concentration 10 mg/mL was placed in the anterior chamber. The wounds were  found to be water tight. The eye was dressed with Vigamox. The patient was given protective glasses to wear throughout the day and a shield with which to sleep tonight. The patient was also given drops with which to begin a drop regimen today and will follow-up with me in one day.    ____________________________ Jerilee FieldWilliam L. Meeah Totino, MD wlp:cg D: 03/28/2014 22:42:02 ET T: 03/29/2014 01:54:12 ET JOB#: [id number]  cc: Kreg Earhart L. Jourdyn Ferrin, MD, <Dictator> Jerilee FieldWILLIAM L Steven Basso MD ELECTRONICALLY SIGNED 04/19/2014 17:05

## 2015-01-13 NOTE — H&P (Signed)
PATIENT NAME:  Terrence Hill, DIDONATO MR#:  [id number] DATE OF BIRTH:  November 09, 1940  DATE OF ADMISSION:  06/27/2014  REFERRING EMERGENCY ROOM PHYSICIAN:  Cecille Amsterdam. Mayford Knife, MD   PRIMARY CARE PHYSICIAN: Dr. Park Breed   CHIEF COMPLAINT:  Confusion and weakness.   HISTORY OF PRESENT ILLNESS:  This very pleasant 74 year old man with past medical history of diabetes, hypertension, gout, presents today with confusion and weakness starting this morning. The history is obtained from his wife, as the patient is confused and a poor historian. Per his wife on September 28, he went to his primary care physician's office for a physical, which was normal with the exception of blood in his urine. At that time, they started him on Bactrim for possible urinary tract infection. She reports that he did fairly well for a few days after starting Bactrim; however, on Sunday, October 4, he began vomiting and having profound diarrhea. He became weak, unable to move around the house easily. He was not able to hold down any food or liquids that day or the next. He awoke this morning at 5:30 a.m. and told her he thought he was dying. Initially, EMS was called. His blood pressure was elevated at 220/110. He was transferred to the Emergency Room, where he received 2 liters of normal saline. Emergency Room evaluation was negative, and he was discharged to home. When he got home, he went to bed, but within a few hours, he fell out of the bed and was once again confused and too weak to get up off the floor. EMS was called again, and he has presented to the Emergency Room. Hospitalist services are asked to admit for further evaluation and treatment.   PAST MEDICAL HISTORY: 1.  Diabetes mellitus type 2.  2.  Hypertension.  3.  Gout.  4.  Atrial fibrillation.  5.  Gastroesophageal reflux disease.   SOCIAL HISTORY:  The patient lives with his wife. He does not smoke cigarettes or drink alcohol. He is retired from [employer] and has been retired for  2 decades. He does not use a cane or walker for ambulation. He does not use home oxygen.   FAMILY MEDICAL HISTORY:  The patient reports no history of coronary artery disease or stroke in his family. No history of cancers. His wife reports that his family members tend to live into their 52s and die of old age.   ALLERGIES:  No known drug allergies.   HOME MEDICATIONS: 1.  Pradaxa 75 mg 1 capsule twice a day.  2.  Zofran 4 mg every 4 hours as needed for nausea.  3.  Losartan 50 mg 1 tablet once a day.  4.  Kombiglyze XR 500 mg/5 mg extended-release tablet 1 tablet once a day.  5.  Escitalopram 20 mg 1 tablet once a day.  6.  Carvedilol 12.5 mg 1 tablet twice a day.  7.  Allopurinol 300 mg 1 tablet once a day   REVIEW OF SYSTEMS:  The patient is unable to give a review of systems due to confusion.   PHYSICAL EXAMINATION: VITAL SIGNS:  Temperature 97.8, pulse 98, respirations 26, blood pressure 141/96, oxygenation 96% on room air.  GENERAL:  No acute distress, very sleepy.  HEENT:  Pupils are equal, round, and reactive to light. Conjunctivae are clear. Extraocular motion is intact. Oral mucous membranes are very dry. Good dentition. No oral lesions or ulcers. Posterior oropharynx is clear without exudate or edema. There is no cervical lymphadenopathy. Trachea  is midline. Thyroid is not palpated.  NECK:  Obese.  RESPIRATORY:  Lungs are clear to auscultation bilaterally with good air movement.  CARDIOVASCULAR:  Irregular. Rate is controlled. No murmurs, rubs, or gallops. Peripheral pulses are 2+. There is no dependent edema.  ABDOMEN:  Soft, nontender, nondistended. Bowel sounds are normal.  MUSCULOSKELETAL:  The patient is able to move all 4 extremities spontaneously. He does not participate in strength testing. There are no tender or swollen joints.  NEUROLOGIC:  Cranial nerves II through XII are grossly intact. This is a nonfocal examination. The patient does seem fairly lethargic and falls  asleep between questions.  PSYCHIATRIC:  Unable to assess due to patient confusion.   LABORATORY DATA:  Sodium 131, potassium 4.1, chloride 103, bicarbonate 21, BUN 21, creatinine 1.49, BNP 1750, calcium 8.2. LFTs are normal with the exception of a decreased serum albumin at 3.3, increased bilirubin at 1.7. Total CK is elevated at 432. Troponins are negative x 3. TSH is 1.78. UDS is negative. White blood cell count is 10.4, hemoglobin 14.8, platelets 113, MCV 88. Initial urinalysis shows 193 red blood cells per high-powered field; repeat after hydration shows 36 red blood cells. There are only 2 white blood cells per high-powered field.   IMAGING:  CT of the head without contrast shows atrophy and chronic microvascular ischemia with no acute abnormality. Chest x-ray shows no acute disease.   ASSESSMENT AND PLAN: 1.  Weakness and confusion:  Urinalysis and chest x-ray are negative for infection. He does report profuse diarrhea. We will check Clostridium difficile and stool cultures. Blood cultures are pending. He has had no fever and no leukocytosis. CT scan of the head is negative for acute findings. His urine drug screen is negative. Serum ethanol is pending. At this point, the etiology of his weakness and confusion is not known but may be due to intestinal infection, could also consider toxic effect of Bactrim.  2.  Acute kidney injury versus chronic kidney disease:  Not sure of his baseline creatinine. Continue with fluids and monitor. 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  3.  Hyponatremia:  Likely due to recent gastrointestinal losses. We will hold his selective serotonin reuptake inhibitor and Bactrim.  4.  Atrial fibrillation initially with rapid ventricular response with a rate in the 120s, which has improved after one dose of labetalol and fluid hydration in the Emergency Room. Admit to telemetry. Continue with Coreg.  5.  Hypertension:   Uncontrolled at presentation. Now controlled after labetalol. We will hold losartan in the setting of possible acute kidney injury. Continue with Coreg and provide hydralazine p.r.n. for elevated blood pressures.  6.  Elevated brain natruretic peptide:  The patient does not have a known history of congestive heart failure. This level is unusually high. We will order a 2-D echocardiogram.  7.  Hematuria and urinary incontinence:  He will need outpatient urology followup after discharge.  8.  Diarrhea:  Check Clostridium difficile and stool cultures.   TIME SPENT ON THIS ADMISSION:  45 minutes.   ____________________________ Ena Dawleyatherine P. Clent RidgesWalsh, MD cpw:nb D: 06/27/2014 22:14:37 ET T: 06/27/2014 22:46:39 ET JOB#: [id number]  cc: Santina Evansatherine P. Clent RidgesWalsh, MD, <Dictator> Gale JourneyATHERINE P WALSH MD ELECTRONICALLY SIGNED 06/28/2014 13:37

## 2015-01-13 NOTE — Discharge Summary (Signed)
PATIENT NAME:  Terrence Hill, Terrence Hill MR#:  [id number] DATE OF BIRTH:  14-Nov-1940  DATE OF ADMISSION:  06/27/2014 DATE OF DISCHARGE:  07/01/2014  PRIMARY CARE PHYSICIAN: Dr. Beverely Risen  DISCHARGE DIAGNOSES: 1. Acute encephalopathy, possibly due to acute respiratory failure with hypoxia and acute kidney injury.  2. Gastrointestinal bleeding.  3. Hematuria.  4. Atrial fibrillation.  5. Abnormal liver function tests and likely fatty liver disease.  6. Thrombocytopenia.  7. Morbid obesity.  8. Obstructive sleep apnea.  9. Hypertension.   CONDITION: Stable.   CODE STATUS: Full code.   HOME MEDICATIONS: Please refer to the medication reconciliation list. The patient will be given Protonix 40 mg p.o. b.i.d. In addition, Pradaxa is discontinued and losartan is discontinued.   The patient needs home health, physical therapy with nurse.   DIET: Low-sodium, low-fat, low-cholesterol, ADA diet.   ACTIVITY: As tolerated.   FOLLOWUP CARE: Followup with PCP and Dr. Bluford Kaufmann within 1 to 2 weeks.   REASON FOR ADMISSION: Confusion and weakness.   HOSPITAL COURSE: The patient is a 74 year old gentleman with hypertension, diabetes, was sent to the ED due to confusion and weakness. The patient was recently treated for a UTI with Bactrim. He was noticed to have nausea, vomiting and profound diarrhea. In the ED, the patient fell on the floor and was too weak to get up off the floor. For a detailed history and physical examination, please refer to the admission note dictated by Dr. Clent Ridges. On admission date, the patient's laboratory data showed sodium 131, potassium 4.1, chloride 103, bicarbonate 21, BUN 21, creatinine 1.49. BNP 1750. Liver function tests were normal except a decreased albumin at 3.3. Troponins were negative x 3. WBC 10.4, hemoglobin 14.8.   1. Acute encephalopathy possibly due to renal failure and respiratory failure. The patient was confused, but mental status has improved after admission. The patient's  CAT scan of the head was negative. Dr. Katrinka Blazing evaluated the patient. He initially suggested MRI of the brain; however, since the patient's mental status has improved, he suggested no need for MRI.  2. Acute respiratory failure. The patient was noticed to have hypoxia, which is possibly due to OSA and morbid obesity. The patient is on CPAP at night. The patient was initially treated with oxygen, but off oxygen with normal O2 saturation. Acute respiratory failure, improved.  3. AKI: After admission, the patient's creatinine increased to 2.01 and BUN increased to 35, Dr. Wynelle Link evaluated the patient and suggested starting IV fluid support. After IV fluid support, the patient's renal function has been improving. BUN decreased to 30, creatinine decreased to 1.49.  4. Afib, rate is controlled. The patient was on Pradaxa, which was discontinued due to GI bleeding.  5. GI bleeding with melena. The patient had 1 episode of GI bleeding with melena but hemoglobin is stable. We requested a GI consult. Dr. Bluford Kaufmann suggested to continue monitoring hemoglobin and if the patient has active bleeding, may need an EGD. However, since the patient has no active bleeding after melena and hemoglobin has been stable, the patient needs to followup Dr. Bluford Kaufmann as an outpatient.  6. Malignant essential hypertension. The patient's blood pressure was high at about 220/110 and has been treated with Lopressor, Coreg and then Norvasc, but blood pressure decreased to the low side, so we held Norvasc, but continued Coreg.  7. Hematuria. The patient's urinalysis is negative for UTI, but has some RBCs, so the patient needs to followup with urology as an outpatient. The patient's Pradaxa  was discontinued, both due to GI bleeding and, hematuria.   The patient's symptoms have much improved. He has no complaints. According to physical therapy evaluation, the patient's needs home health and physical therapy. The patient is clinically stable and will be  discharged to her home with home health and PT today.   I discussed the patient's discharge plan with the patient, nurse, Dr. Wynelle LinkKolluru and social worker.    TIME SPENT: About 40 minutes.    ____________________________ Shaune PollackQing Kymani Shimabukuro, MD qc:TT D: 07/01/2014 14:06:59 ET T: 07/01/2014 14:38:36 ET JOB#: [id number]  cc: Shaune PollackQing Gini Caputo, MD, <Dictator> Shaune PollackQING Neamiah Sciarra MD ELECTRONICALLY SIGNED 07/01/2014 15:25

## 2015-01-13 NOTE — Consult Note (Signed)
Referring Physician:  Aldean Jewett :   Primary Care Physician:  Aldean Jewett : [place], [address], [place], [place] [address], [place] [phone]  Reason for Consult: Admit Date: 27-Jun-2014  Chief Complaint: weakness and confusion  Reason for Consult: altered mental status   History of Present Illness: History of Present Illness:   74 yo RHD M who presents to [facility] after family insisted that he come to hospital due to confusion and generalized weakness.  Pt has been very sleepy as well.  There was a concern that it maybe secondary to bactrim or some other new medication that pt has taken.  He does report some prior diarrhea.  There is no family at bedside currently and history is hard to obtain.  ROS:  Review of Systems   difficult to obtain secondary to mental status  Past Medical/Surgical Hx:  irreg heartbeat:   gout:   hyperlipidemia:   GERD:   DM:   Past Medical/ Surgical Hx:  Past Medical History reviewed by me as above   Past Surgical History reviewed by me as above   Home Medications: Medication Instructions Last Modified Date/Time  ondansetron 4 mg oral tablet 1 tab(s) orally every 8 hours, As Needed- for Nausea, Vomiting  06-Oct-15 16:12  carvedilol 12.5 mg oral tablet 1 tab(s) orally 2 times a day 06-Oct-15 16:12  Kombiglyze XR 500 mg-5 mg oral tablet, extended release 1 tab(s) orally once a day (in the evening) 06-Oct-15 16:12  Pradaxa 75 mg oral capsule 1 cap(s) orally 2 times a day 06-Oct-15 16:12  escitalopram 20 mg oral tablet 1 tab(s) orally once a day 06-Oct-15 16:12  allopurinol 300 mg oral tablet 1 tab(s) orally once a day 06-Oct-15 16:12  losartan 50 mg oral tablet 1 tab(s) orally once a day 06-Oct-15 16:12   Allergies:  No Known Allergies:   Allergies:  Allergies NKDA   Social/Family History: Employment Status: retired  Lives With: spouse  Living Arrangements: house  Social History: no tob, no EtOH, no illicits   Family History: no seizures, no stroke   Vital Signs: **Vital Signs.:   [id number] 92:92  Systolic BP Systolic BP 446    28:63  Vital Signs Type Routine  Temperature Temperature (F) 98  Celsius 36.6  Pulse Pulse 91  Respirations Respirations 20  Systolic BP Systolic BP 817  Diastolic BP (mmHg) Diastolic BP (mmHg) 94  Mean BP 111  Pulse Ox % Pulse Ox % 95  Pulse Ox Activity Level  At rest  Oxygen Delivery 2L   Physical Exam: General: morbidly obese, moderate distress  HEENT: normocephalic, sclera nonicteric, oropharynx clear  Neck: supple, no JVD, no bruits  Chest: CTA B, no wheezing, good movement  Cardiac: RRR, no murmurs, no edema, 2+ pulses  Extremities: no C/C/E, FROM   Neurologic Exam: Mental Status: lethargic and oriented to person only, intermittently follows, moderate dysarthria  Cranial Nerves: PERRLA, EOMI, nl VF, face symmetric, tongue midline, shoulder shrug equal  Motor Exam: 5-/5 B, normal tone, no tremor  Deep Tendon Reflexes: 0/4 B, mute plantars B  Sensory Exam: withdrawals to pain  Coordination: untestable   Lab Results: Thyroid:  06-Oct-15 15:06   Thyroid Stimulating Hormone 1.78 (0.45-4.50 (IU = International Unit)  ----------------------- Pregnant patients have  different reference  ranges for TSH:  - - - - - - - - - -  Pregnant, first trimetser:  0.36 - 2.50 uIU/mL)  Hepatic:  07-Oct-15 06:00   Bilirubin, Total  1.6  Bilirubin, Direct  0.3 (Result(s) reported on 28 Jun 2014 at 09:09AM.)  Alkaline Phosphatase 87 (46-116 NOTE: New Reference Range 04/11/14)  SGPT (ALT) 26 (14-63 NOTE: New Reference Range 04/11/14)  SGOT (AST)  40  Total Protein, Serum 7.4  Albumin, Serum  3.2  Routine Chem:  06-Oct-15 15:06   Ethanol, S. < 3 (Result(s) reported on 27 Jun 2014 at 06:04PM.)  B-Type Natriuretic Peptide [facility])  11750 (Result(s) reported on 27 Jun 2014 at 03:35PM.)  07-Oct-15 01:41   Result Comment TROPONIN - RESULTS VERIFIED BY REPEAT  TESTING.  - C/ BECKY BUERER @0321  06-28-14.Marland KitchenAJO  - READ-BACK PROCESS PERFORMED.  Result(s) reported on 28 Jun 2014 at 03:24AM.    06:00   Glucose, Serum  151  BUN  23  Creatinine (comp)  1.77  Sodium, Serum 136  Potassium, Serum 3.6  Chloride, Serum 104  CO2, Serum 24  Calcium (Total), Serum  8.4  Anion Gap 8  Osmolality (calc) 279  eGFR (African American)  49  eGFR (Non-African American)  40 (eGFR values <62m/min/1.73 m2 may be an indication of chronic kidney disease (CKD). Calculated eGFR, using the MRDR Study equation, is useful in  patients with stable renal function. The eGFR calculation will not be reliable in acutely ill patients when serum creatinine is changing rapidly. It is not useful in patients on dialysis. The eGFR calculation may not be applicable to patients at the low and high extremes of body sizes, pregnant women, and vetetarians.)    09:56   LDH, Serum  311 (Result(s) reported on 28 Jun 2014 at 10:28AM.)  Ammonia, Plasma 27 (Result(s) reported on 28 Jun 2014 at 10:26AM.)  Urine Drugs:  [id number]  Tricyclic Antidepressant, Ur Qual (comp) NEGATIVE (Result(s) reported on 27 Jun 2014 at 06:40PM.)  Amphetamines, Urine Qual. NEGATIVE  MDMA, Urine Qual. NEGATIVE  Cocaine Metabolite, Urine Qual. NEGATIVE  Opiate, Urine qual NEGATIVE  Phencyclidine, Urine Qual. NEGATIVE  Cannabinoid, Urine Qual. NEGATIVE  Barbiturates, Urine Qual. NEGATIVE  Benzodiazepine, Urine Qual. NEGATIVE (----------------- The URINE DRUG SCREEN provides only a preliminary, unconfirmed analytical test result and should not be used for non-medical  purposes.  Clinical consideration and professional judgment should be  applied to any positive drug screen result due to possible interfering substances.  A more specific alternate chemical method must be used in order to obtain a confirmed analytical result.  Gas chromatography/mass spectrometry (GC/MS) is the preferred confirmatory  method.)  Methadone, Urine Qual. NEGATIVE  Cardiac:  07-Oct-15 06:00   Troponin I 0.04 (0.00-0.05 0.05 ng/mL or less: NEGATIVE  Repeat testing in 3-6 hrs  if clinically indicated. >0.05 ng/mL: POTENTIAL  MYOCARDIAL INJURY. Repeat  testing in 3-6 hrs if  clinically indicated. NOTE: An increase or decrease  of 30% or more on serial  testing suggests a  clinically important change)  CK, Total  519 (39-308 NOTE: NEW REFERENCE RANGE  10/24/2013)  CPK-MB, Serum 2.7 (Result(s) reported on 28 Jun 2014 at 07:10AM.)  Routine UA:  06-Oct-15 17:25   Color (UA) Yellow  Clarity (UA) Hazy  Glucose (UA) 50 mg/dL  Bilirubin (UA) Negative  Ketones (UA) Trace  Specific Gravity (UA) 1.019  Blood (UA) 3+  pH (UA) 5.0  Protein (UA) 100 mg/dL  Nitrite (UA) Negative  Leukocyte Esterase (UA) Negative (Result(s) reported on 27 Jun 2014 at 06:19PM.)  RBC (UA) 36 /HPF  WBC (UA) 3 /HPF  Bacteria (UA) NONE SEEN  Epithelial Cells (UA) <1 /HPF  Mucous (UA)  PRESENT (Result(s) reported on 27 Jun 2014 at 06:19PM.)  Routine Coag:  07-Oct-15 09:56   D-Dimer, Quantitative 1803 (INTERPRETATION <> Exclusion of Venous Thromboembolism (VTE) - OUTPATIENT ONLY       (Emergency Department or [place])             0-499 ng/ml (FEU)  : With a low to intermediate pretest                                  probability for VTE this test result                                  excludes the diagnosis of VTE.             > 499 ng/ml (FEU)  : VTE not excluded; additional work up                                  for VTE is required. <> Testing on Inpatients and Evaluation of Disseminated Intravascular        Coagulation (DIC)             Reference Range:  0-499 ng/ml (FEU))  Routine Hem:  07-Oct-15 06:00   WBC (CBC) 9.8  RBC (CBC) 5.05  Hemoglobin (CBC) 14.3  Hematocrit (CBC) 43.8  Platelet Count (CBC)  117  MCV 87  MCH 28.3  MCHC 32.7  RDW  14.8  Neutrophil % 83.4  Lymphocyte % 8.6  Monocyte % 7.6  Eosinophil  % 0.1  Basophil % 0.3  Neutrophil #  8.2  Lymphocyte #  0.8  Monocyte # 0.7  Eosinophil # 0.0  Basophil # 0.0 (Result(s) reported on 28 Jun 2014 at 06:27AM.)  Bands 6  Segmented Neutrophils 77  Lymphocytes 7  Monocytes 9  Metamyelocyte 1  Diff Comment 1 ANISOCYTOSIS  Diff Comment 2 POIKILOCYTOSIS  Diff Comment 3 PLTS VARIED IN SIZE  Result(s) reported on 28 Jun 2014 at 06:27AM.    09:56   Manual Diff MANUAL DIFF DONE  Result(s) reported on 28 Jun 2014 at 11:19AM.   Radiology Results: CT:    06-Oct-15 14:28, CT Head Without Contrast  CT Head Without Contrast   REASON FOR EXAM:    ams  COMMENTS:   May transport without cardiac monitor    PROCEDURE: CT  - CT HEAD WITHOUT CONTRAST  - Jun 27 2014  2:28PM     CLINICAL DATA:  Altered mental status.  Found on floor at home    EXAM:  CT HEAD WITHOUT CONTRAST    TECHNIQUE:  Contiguous axial images were obtained from the base of the skull  through the vertex without intravenous contrast.    COMPARISON:  CT head 10/07/2012  FINDINGS:  Generalized atrophy. Moderate chronic microvascular ischemic changes  in the white matter unchanged    Negative for acute infarct.  Negative for hemorrhage or mass.    Calvarium intact.  Mucosal edema in the maxillary sinus bilaterally.     IMPRESSION:  Atrophy and chronic microvascular ischemia.  No acute abnormality.      Electronically Signed    By: Franchot Gallo M.D.    On: 06/27/2014 14:34     Verified By: Truett Perna, M.D.,   Radiology Impression: Radiology Impression: CT  of head personally reviewed by me and shows normal brain   Impression/Recommendations: Recommendations:   prior notes reviewed by me  reviewed by me   Encephalopathy- moderate,  this appears to be secondary to either infection vs. toxic/metabolic source vs. cardiogenic;  PRES would typically show CT changes HTN-  better control now EEG will try MRI of brain w/o contrast to r/o PRES keep SBP < 160 check  procalcitonin, ESR, CRP, TSH, B12 hold sedating medications will follow  Electronic Signatures: Jamison Neighbor (MD)  (Signed 07-Oct-15 20:39)  Authored: REFERRING PHYSICIAN, Primary Care Physician, Consult, History of Present Illness, Review of Systems, PAST MEDICAL/SURGICAL HISTORY, HOME MEDICATIONS, ALLERGIES, Social/Family History, NURSING VITAL SIGNS, Physical Exam-, LAB RESULTS, RADIOLOGY RESULTS, Recommendations   Last Updated: 07-Oct-15 20:39 by Jamison Neighbor (MD)

## 2015-01-13 NOTE — Consult Note (Signed)
PATIENT NAME:  Terrence Hill, Terrence Hill MR#:  [id number] DATE OF BIRTH:  Mar 26, 1941  DATE OF CONSULTATION:  06/29/2014  REFERRING PHYSICIAN:   CONSULTING PHYSICIAN:  Corky Sox. Zettie Pho, PA-C  ATTENDING GASTROENTEROLOGIST: Dr. Verdie Shire.    REFERRING PHYSICIAN:  Dr. Bridgett Larsson.    REASON FOR CONSULTATION: Melena.   HISTORY OF PRESENT ILLNESS: This is a pleasant 74 year old gentleman who has been inpatient for the past few days for issues with altered mental status and confusion. Since being admitted he was found to have some elevated kidney function, elevated cardiac enzymes, and elevated BNP, LDH, and ESR. He has undergone extensive workup including a chest x-ray, CT scan of the head, a lung V/Q scan, and ultrasound of the kidneys and overall have not been able to identify any acute changes. There were some nonobstructing kidney stones bilaterally, but otherwise workup thus far as been unrevealing. Around the time that symptoms began he also had new onset diarrhea. He explains that for about a 48 hour time period he was having explosive diarrhea including having some accidents associated with this. He does admit that overall the urgent frequent diarrhea has improved significantly. He does not recall having any bowel movements yesterday and today he has had 2. We were consulted because it did appear that his stool this morning was black. Per the patient it was very dark, sticky-looking, and today was the first day that he has seen this color. He denies noticing any bright red blood or hematochezia. He is denying any abdominal pain. Clostridium difficile was tested and was negative. Stool culture was obtained, but is currently pending. He was found to be heme positive. Of note his hemoglobin has remained stable and is currently 13.2. Overall the patient is starting to feel significantly better compared to when he was first admitted as far as feeling sharper and less confused. He also does feel that the diarrhea has significantly  let up. Neurology has also been following along with the patient. He does have a past medical history significant for gastritis and peptic ulcer disease several years ago when he was taking Coumadin. He admits because of some complications that he had with the Coumadin and struggling to keep his INR therapeutic he was switched to Pradaxa  which he does report he has been taking recently. There has been no nausea or vomiting. No fever or chills. No chest pain or shortness of breath.   PAST MEDICAL HISTORY: Diabetes mellitus type 2, gout, obesity, atrial fibrillation, GERD, hypertension, history of peptic ulcer disease.   ALLERGIES: No known drug allergies.   HOME MEDICATIONS: Zofran, Pradaxa, escitalopram, Kombiglyze, losartan, carvedilol, allopurinol.   SOCIAL HABITS: The patient denies any alcohol, tobacco, or illicit drug use. He is married.   FAMILY HISTORY: There is no known family history of GI malignancy, colon polyps, or IBD in the family that he is aware of.   REVIEW OF SYSTEMS: 10 system review of systems was obtained on the patient. Pertinent positives are mentioned above and otherwise negative.   OBJECTIVE:  VITAL SIGNS: Blood pressure 119/81, heart rate 94, respirations 20, temperature 97.5, bedside pulse oximetry 96%.  GENERAL: This is a pleasant 74 year old gentleman resting quietly and comfortably in bed in no acute distress. He is alert and oriented x 3, though when obtaining his history he does get somewhat confused when recalling his history.  HEAD: Atraumatic, normocephalic.  NECK: Supple. No lymphadenopathy noted.  HEENT: Sclerae anicteric. Mucus membranes moist.  LUNGS: Respirations are even and unlabored.  Clear to auscultation bilateral anterior lung fields.  HEART: Regular rate and rhythm. S1, S2 noted.  ABDOMEN: Soft, nontender, nondistended. Normoactive bowel sounds noted in all 4 quadrants. No guarding or rebound. No masses, hernias, or organomegaly appreciated.   PSYCHIATRIC: Appropriate mood and affect.  NEUROLOGIC: Cranial nerves II-XII are grossly intact.  RECTAL: Deferred, though he has previously been heme tested and this was positive.   LABORATORY DATA: White blood cells 8.3, hemoglobin 13.2, hematocrit 42.1, platelets 122,000. Sodium 136, potassium 3.4, BUN 35, creatinine 2.01, glucose 141. MCV is 88. CK is 519. CPK 2.7. Troponin 0.06. Urine drug screen was negative. ESR was 30. TSH 1.44. Ammonia 27. LDH 311. Bilirubin 1.6, direct bilirubin 0.3, alkaline phosphatase 87, AST 40, ALT 26, albumin 3.2. BNP is 11,750. Clostridium difficile was negative. Blood culture is negative. He was heme positive. Stool cultures pending.   ASSESSMENT:  1.  Altered mental status, gradually improving.  2.  Possible melena. He did have a dark tarry stool this morning with a recent history of diarrhea. The diarrhea overall has improved but today was the first day that he saw signs of possible melena.  3.  Hemoccult positive stool.  4.  History of atrial fibrillation on Pradaxa therapy.  5.  Elevated BNP and cardiac enzymes.  6.  Malignant hypertension, improved since admission.  7.  Acute renal failure on chronic kidney disease stage III.   PLAN: I have discussed this patient's case in detail with Dr. Verdie Shire, who is involved in the development of the patient's plan of care. We did review this patient's past medical history and did find prior endoscopies where he was found to have gastritis and peptic ulcer disease. Certainly with new onset melena beginning this morning it is possible he may have an upper GI bleed, however his hemoglobin and vital signs have remained stable. He is also having multiple other medical issues going on including acute renal failure in the setting of chronic kidney disease, as well as an elevated BNP and some altered mental status. Therefore we would like to hold off on endoscopic intervention at this time unless there are signs of a declining  hemoglobin or he becomes hemodynamically unstable. In the interim we would like to continue to monitor his hemoglobin closely and start him on b.i.d. dosing of a PPI. We are also awaiting the stool cultures for his recent history of diarrhea, although these symptoms generally seem to be improving already. This plan was explained to the patient in detail and he verbalized understanding and all questions were answered.   Thank you so much for this consultation and for allowing [place] to participate in the patient's plan of care.     ____________________________ Corky Sox. Milan Perkins, PA-C [id number] D: 06/29/2014 15:03:31 ET T: 06/29/2014 16:01:46 ET JOB#: [id number]  cc: Corky Sox. Deberah Adolf, PA-C, <Dictator> [place] PA ELECTRONICALLY SIGNED 06/30/2014 8:54

## 2015-01-13 NOTE — Consult Note (Signed)
Chief Complaint:  Subjective/Chief Complaint No further bleeding or diarrhea. On CPAP now. BP better. Slight drop in hgb.   VITAL SIGNS/ANCILLARY NOTES: **Vital Signs.:   09-Oct-15 09:50  Vital Signs Type Routine  Temperature Temperature (F) 97.3  Celsius 36.2  Temperature Source oral  Pulse Pulse 78  Respirations Respirations 18  Systolic BP Systolic BP 478  Diastolic BP (mmHg) Diastolic BP (mmHg) 76  Mean BP 86  Pulse Ox % Pulse Ox % 97  Oxygen Delivery Room Air/ 21 %   Brief Assessment:  GEN no acute distress   Cardiac Regular   Respiratory clear BS   Gastrointestinal Normal   Lab Results: Hepatic:  09-Oct-15 05:55   Bilirubin, Total  1.4  Alkaline Phosphatase 95 (46-116 NOTE: New Reference Range 04/11/14)  SGPT (ALT) 42 (14-63 NOTE: New Reference Range 04/11/14)  SGOT (AST)  41  Total Protein, Serum 6.6  Albumin, Serum  2.6  Routine Chem:  09-Oct-15 05:55   Glucose, Serum  134  BUN  37  Creatinine (comp)  1.69  Sodium, Serum 139  Potassium, Serum  3.3  Chloride, Serum 104  CO2, Serum 26  Calcium (Total), Serum  8.0  Osmolality (calc) 288  eGFR (African American)  51  eGFR (Non-African American)  42 (eGFR values <20m/min/1.73 m2 may be an indication of chronic kidney disease (CKD). Calculated eGFR, using the MRDR Study equation, is useful in  patients with stable renal function. The eGFR calculation will not be reliable in acutely ill patients when serum creatinine is changing rapidly. It is not useful in patients on dialysis. The eGFR calculation may not be applicable to patients at the low and high extremes of body sizes, pregnant women, and vetetarians.)  Anion Gap 9  Routine Coag:  09-Oct-15 05:55   Prothrombin  17.2  INR 1.4 (INR reference interval applies to patients on anticoagulant therapy. A single INR therapeutic range for coumarins is not optimal for all indications; however, the suggested range for most indications is 2.0 -  3.0. Exceptions to the INR Reference Range may include: Prosthetic heart valves, acute myocardial infarction, prevention of myocardial infarction, and combinations of aspirin and anticoagulant. The need for a higher or lower target INR must be assessed individually. Reference: The Pharmacology and Management of the Vitamin K  antagonists: the seventh ACCP Conference on Antithrombotic and Thrombolytic Therapy. CGNFAO.1308Sept:126 (3suppl): [id number] A HCT value >55% may artifactually increase the PT.  In one study,  the increase was an average of 25%. Reference:  "Effect on Routine and Special Coagulation Testing Values of Citrate Anticoagulant Adjustment in Patients with High HCT Values." American Journal of Clinical Pathology 2006;126:400-405.)  Routine Hem:  09-Oct-15 05:55   WBC (CBC) 7.0  RBC (CBC) 4.57  Hemoglobin (CBC)  12.7  Hematocrit (CBC)  39.9  Platelet Count (CBC)  114  MCV 87  MCH 27.7  MCHC  31.7  RDW  15.0  Neutrophil % 67.1  Lymphocyte % 20.5  Monocyte % 11.0  Eosinophil % 0.8  Basophil % 0.6  Neutrophil # 4.7  Lymphocyte # 1.4  Monocyte # 0.8  Eosinophil # 0.1  Basophil # 0.0 (Result(s) reported on 30 Jun 2014 at 06:21AM.)   Assessment/Plan:  Assessment/Plan:  Assessment UGI bleeding. Melena yesterday. Heme positive stool. Predexa put on hold.   Plan Continue protonix bid. Moniter hgb. If hgb drops with more melena, then notify Dr. SGustavo Lahon call. Consider EGD on Monday only if bleeding persists. thanks.   Electronic Signatures: Delphia Kaylor,  Eddie Dibbles (MD)  (Signed 09-Oct-15 11:07)  Authored: Chief Complaint, VITAL SIGNS/ANCILLARY NOTES, Brief Assessment, Lab Results, Assessment/Plan   Last Updated: 09-Oct-15 11:07 by Verdie Shire (MD)

## 2016-04-10 IMAGING — CR DG CHEST 1V PORT
1 series · 1 of 1 positions shown · non-contrast
Comparison: Single view of the chest 06/27/2014. CT chest
11/03/2005.

CLINICAL DATA: Patient found down this morning. Difficulty
breathing.

EXAM:
PORTABLE CHEST - 1 VIEW

[ap]
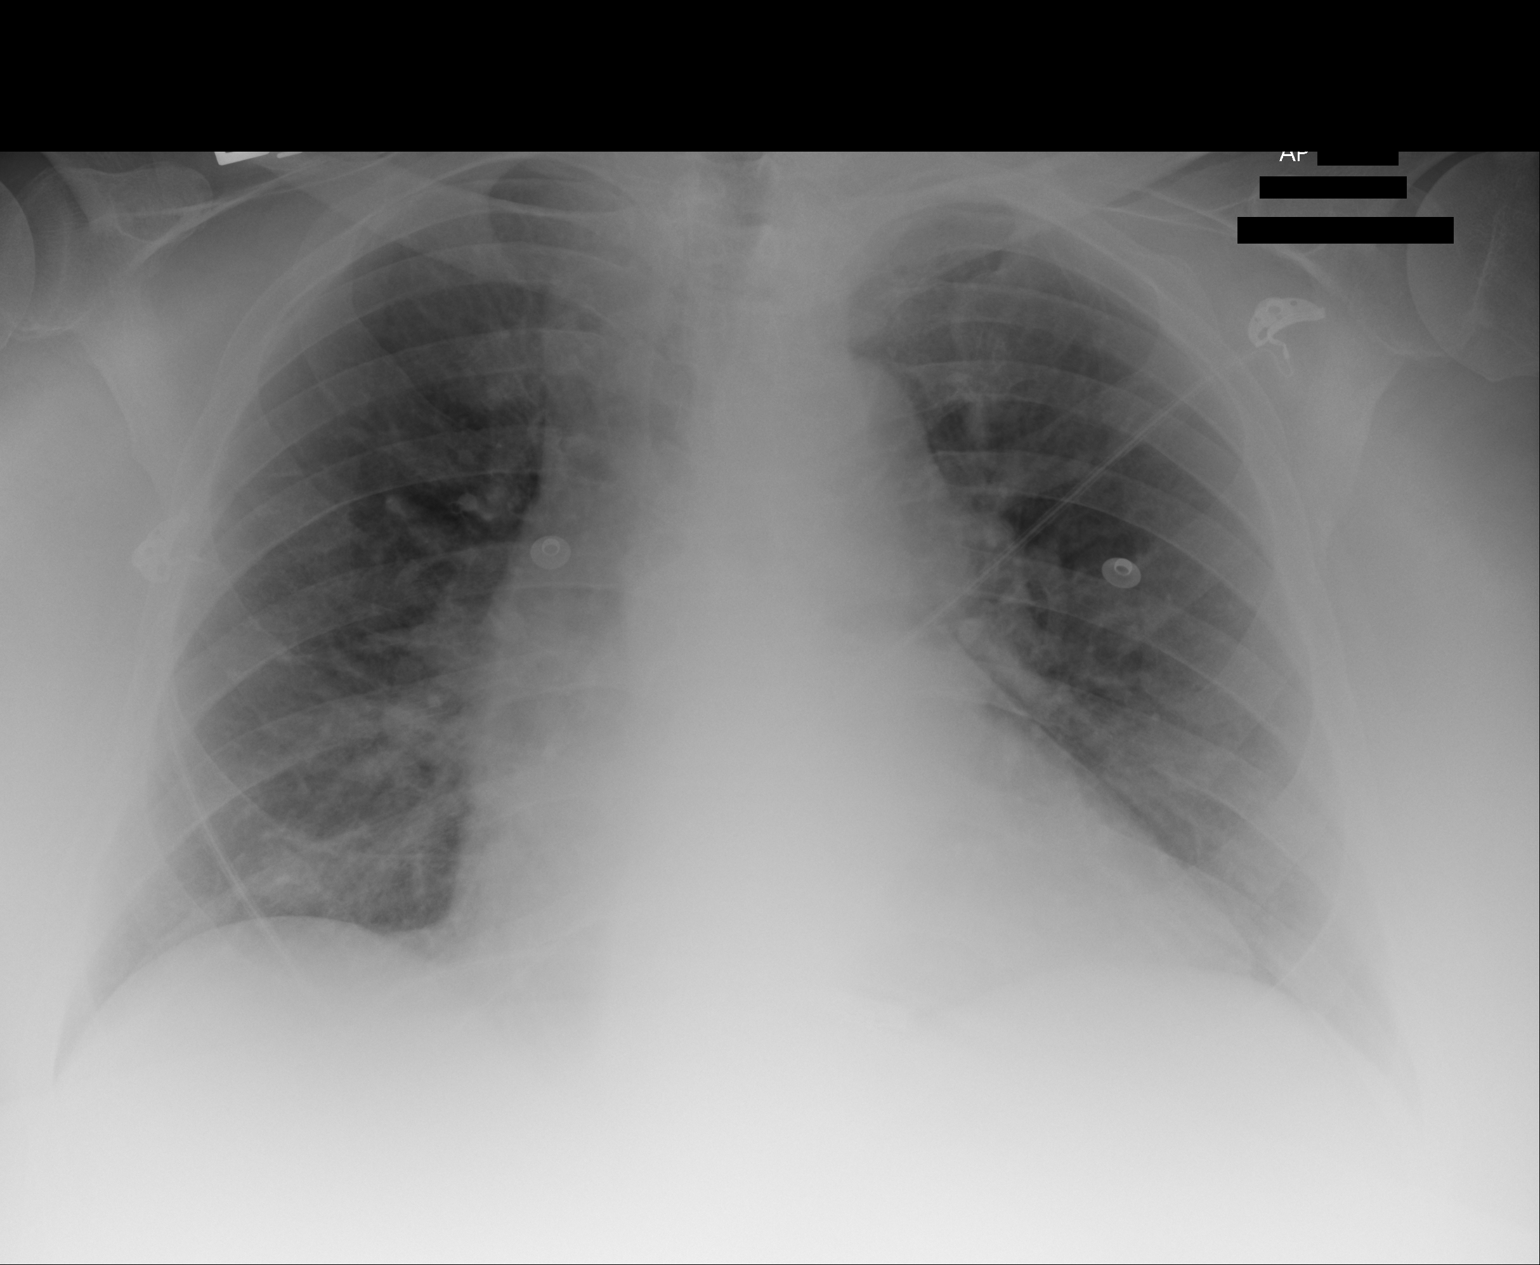

[1 of 1 positions shown; findings below may reference images not displayed]

FINDINGS: The lungs are clear. Heart size is upper normal. No pneumothorax or
pleural effusion.
IMPRESSION: No acute disease.

## 2016-07-13 IMAGING — MR MR CERVICAL SPINE W/O CM
5 series · 30 of 48 positions shown · non-contrast
Comparison: None.

CLINICAL DATA: Right-sided neck pain

EXAM:
MRI CERVICAL SPINE WITHOUT CONTRAST
TECHNIQUE: Multiplanar, multisequence MR imaging of the cervical spine was
performed. No intravenous contrast was administered.

[Series 3: T2 · sagittal · 3.0mm · 0.66mm/px · 6 of 12 slices shown (1 of 2)]
[im 1/12]
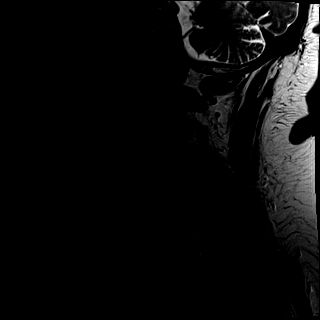
[im 3/12]
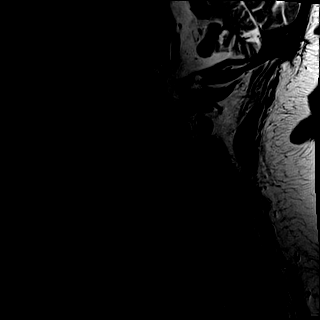
[im 5/12]
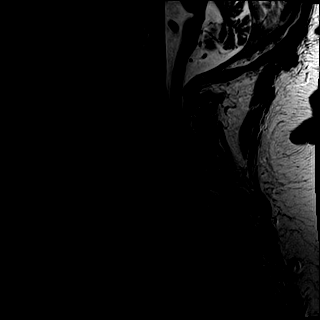
[im 7/12]
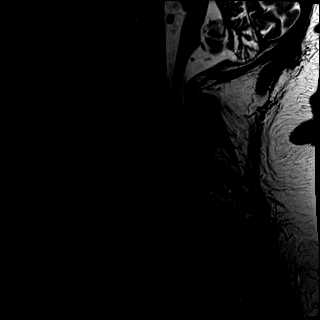
[im 9/12]
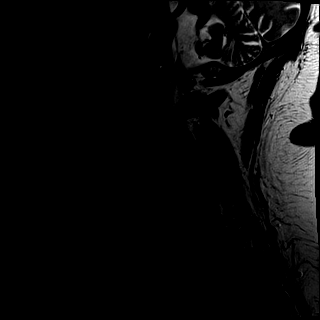
[im 12/12]
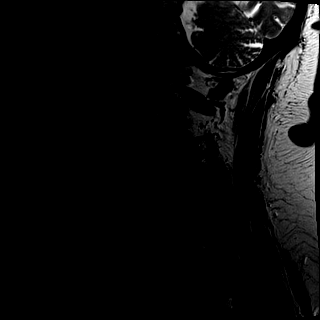

[Series 4: T1 · sagittal · 3.0mm · 0.66mm/px · 6 of 12 slices shown]
[im 1/12]
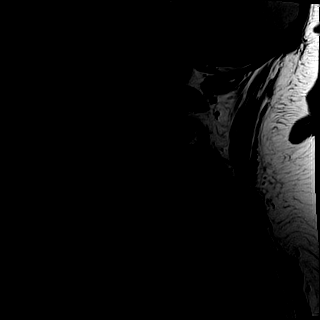
[im 3/12]
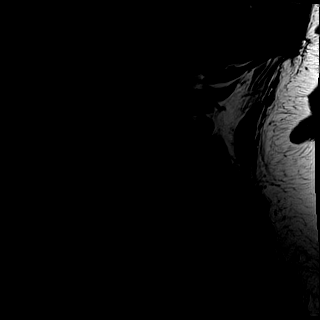
[im 5/12]
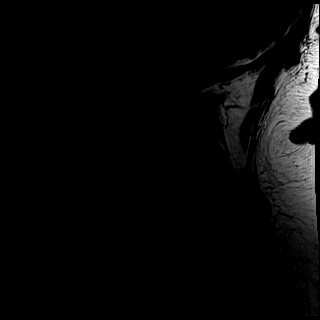
[im 7/12]
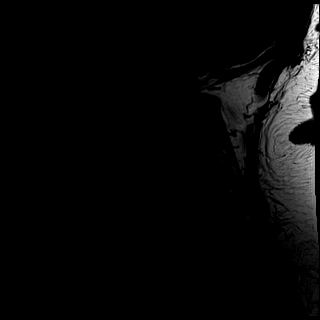
[im 9/12]
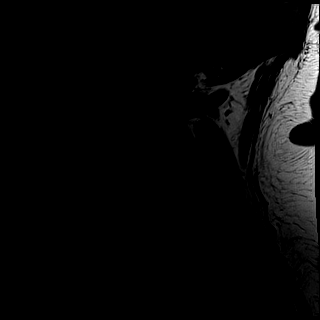
[im 12/12]
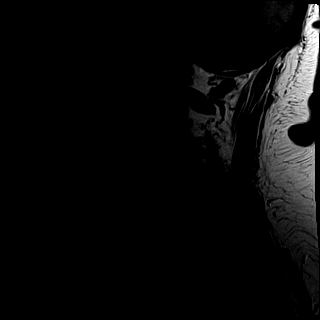

[Series 5: T2 · axial · 3.0mm · 0.59mm/px · z∈[-107,+1]mm · 9 of 30 slices shown (2 of 2)]
[im 1/30]
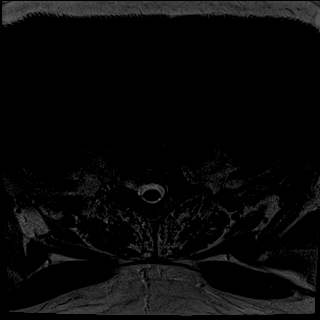
[im 5/30]
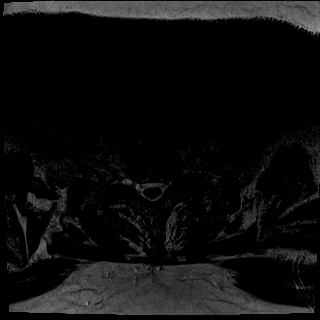
[im 9/30]
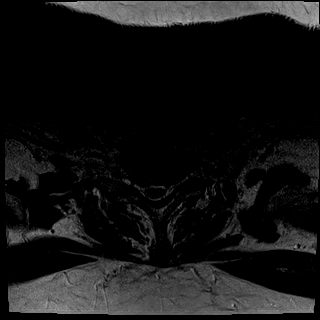
[im 13/30]
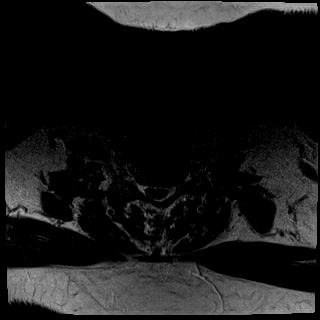
[im 15/30]
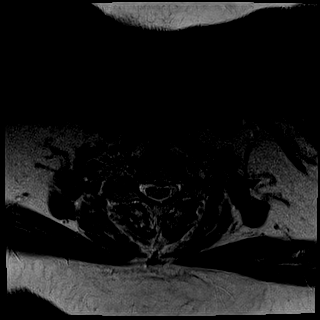
[im 17/30]
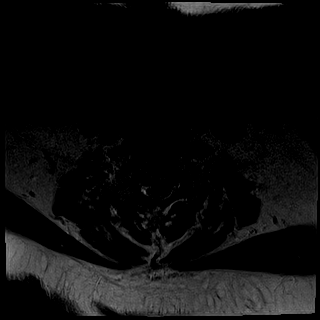
[im 21/30]
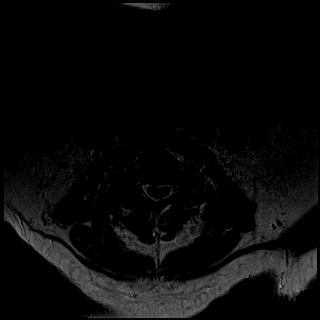
[im 25/30]
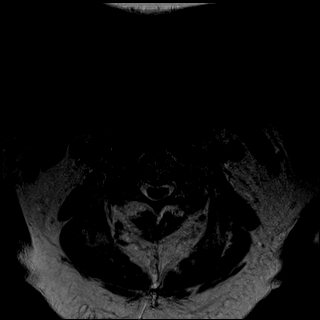
[im 30/30]
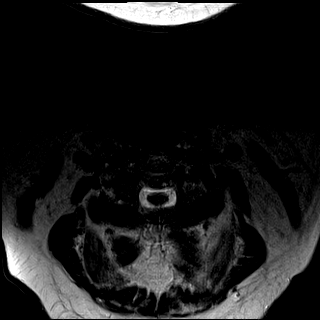

[Series 6: tir sag · sagittal · 3.0mm · 0.41mm/px · 6 of 12 slices shown]
[im 1/12]
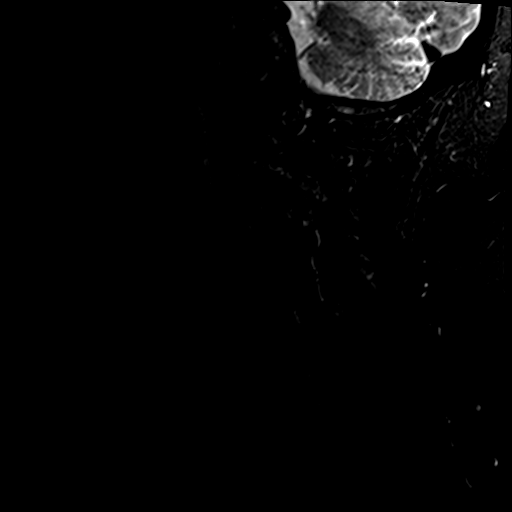
[im 3/12]
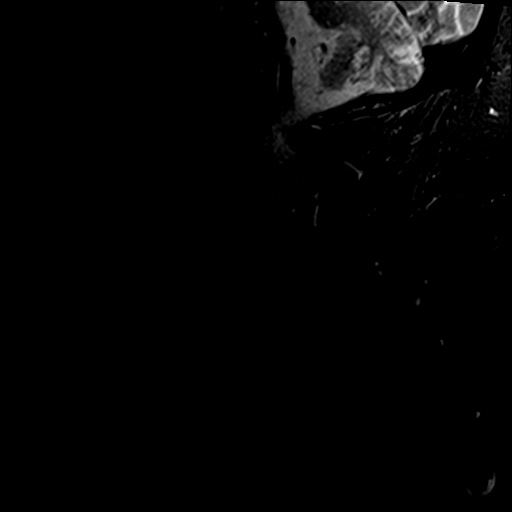
[im 5/12]
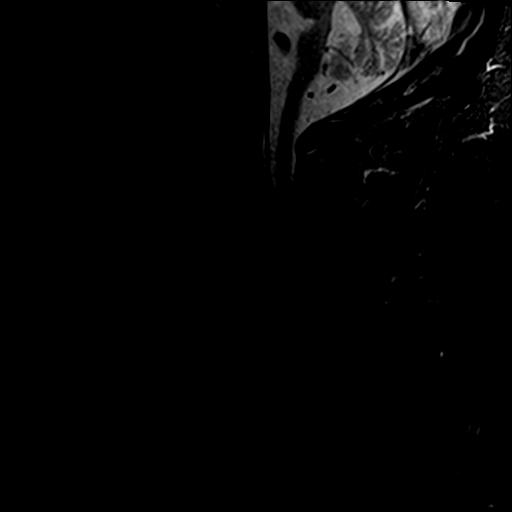
[im 7/12]
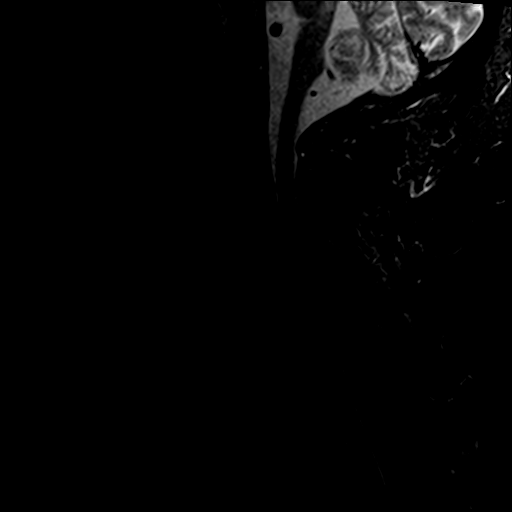
[im 9/12]
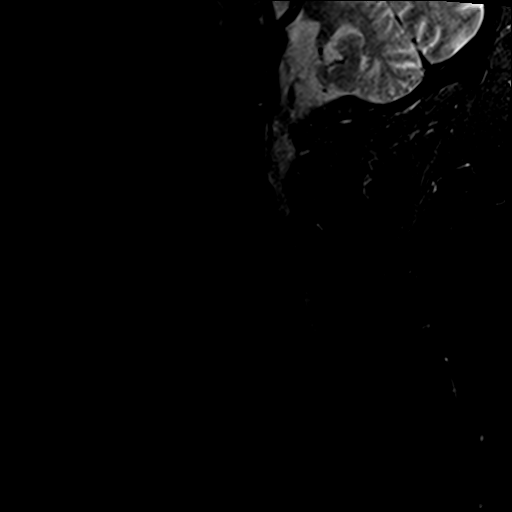
[im 12/12]
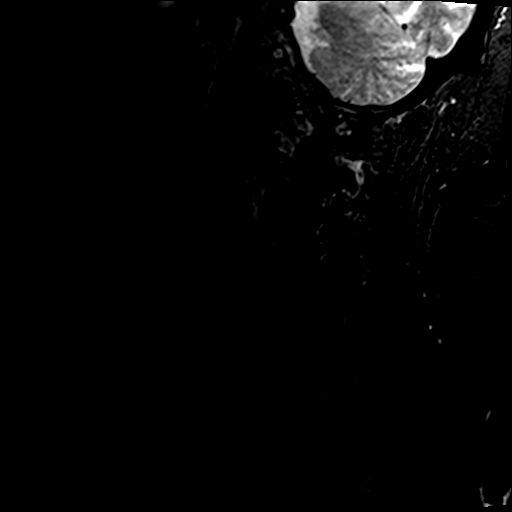

[Series 7: GRE · axial · 3.0mm · 0.37mm/px · z∈[-107,-77]mm · 3 of 30 slices shown]
[im 1/30]
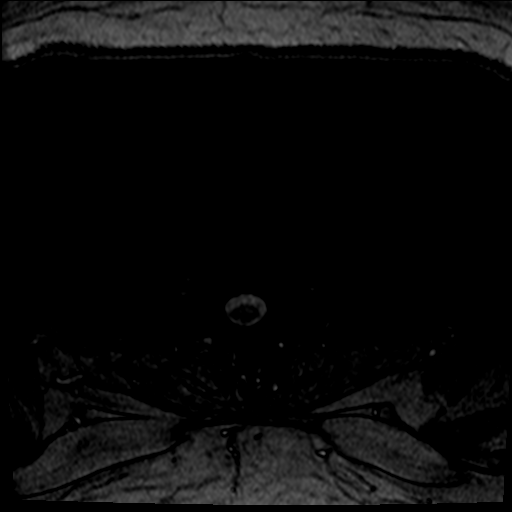
[im 5/30]
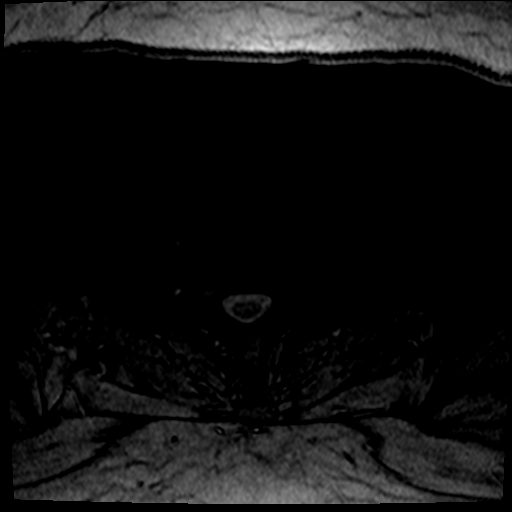
[im 9/30]
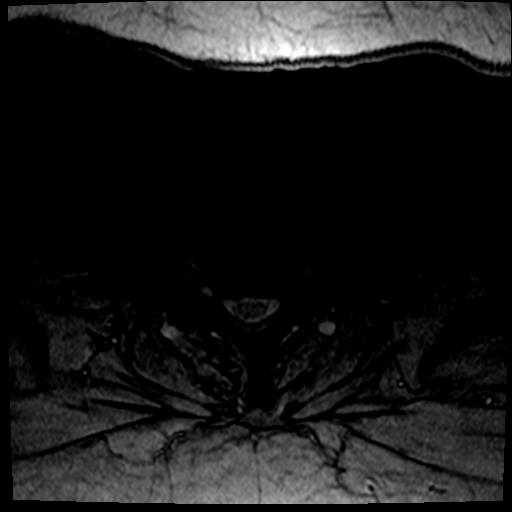

[30 of 48 positions shown; findings below may reference images not displayed]

FINDINGS: Normal signal is present in the cervical and upper thoracic spinal
cord to the lowest imaged level, T1-2. Chronic endplate marrow
changes are most evident at C4-5 and to lesser extent at C3-4.
Vertebral body heights alignment are maintained. The craniocervical
junction is within normal limits. The visualized intracranial
contents are normal.

Flow is present in the vertebral arteries.

C2-3: Mild left-sided uncovertebral spurring and moderate left facet
hypertrophy results in mild left foraminal narrowing.

C3-4: A broad-based disc osteophyte complex effaces the ventral CSF.
Uncovertebral and facet hypertrophy results in moderate left and
mild right foraminal stenosis.

C4-5: A broad-based disc osteophyte complex effaces the ventral CSF
and narrows the canal to 8 mm. Uncovertebral and facet disease
results in moderate to severe right and moderate left foraminal
stenosis.

C5-6: A broad-based disc osteophyte complex is asymmetric to the
left. There is partial effacement of ventral CSF. Moderate left and
mild right foraminal narrowing is evident.

C6-7: A broad-based disc osteophyte complex effaces the ventral CSF.
Moderate foraminal stenosis is secondary to uncovertebral disease,
worse on the left.

C7-T1: Mild facet hypertrophy is present without significant disc
protrusion or stenosis.
IMPRESSION: 1. Multilevel spondylosis of the cervical spine as described above.
2. Mild left foraminal narrowing at C2-3.
3. Moderate left and mild right foraminal stenosis at C3-4 with mild
to moderate central canal narrowing.
4. Moderate central and left foraminal stenosis at C4-5. Right
foraminal narrowing is severe.
5. Mild central and right foraminal stenosis at C5-6. Left foraminal
narrowing is moderate.
6. Mild to moderate central and moderate foraminal narrowing
bilaterally at C6-7, worse on the left.
